# Patient Record
Sex: Female | Born: 1945 | Race: White | Hispanic: No | Marital: Married | Smoking: Never smoker
Health system: Southern US, Community
[De-identification: ages and names within clinical notes are randomized; demographics above are authoritative.]

## PROBLEM LIST (undated history)

## (undated) DIAGNOSIS — M7989 Other specified soft tissue disorders: Secondary | ICD-10-CM

## (undated) DIAGNOSIS — G4700 Insomnia, unspecified: Secondary | ICD-10-CM

## (undated) DIAGNOSIS — Z6841 Body Mass Index (BMI) 40.0 and over, adult: Secondary | ICD-10-CM

## (undated) DIAGNOSIS — Z8249 Family history of ischemic heart disease and other diseases of the circulatory system: Secondary | ICD-10-CM

## (undated) DIAGNOSIS — E785 Hyperlipidemia, unspecified: Secondary | ICD-10-CM

## (undated) DIAGNOSIS — R931 Abnormal findings on diagnostic imaging of heart and coronary circulation: Secondary | ICD-10-CM

## (undated) HISTORY — DX: Family history of ischemic heart disease and other diseases of the circulatory system: Z82.49

## (undated) HISTORY — DX: Insomnia, unspecified: G47.00

## (undated) HISTORY — DX: Abnormal findings on diagnostic imaging of heart and coronary circulation: R93.1

## (undated) HISTORY — DX: Hyperlipidemia, unspecified: E78.5

## (undated) HISTORY — PX: TONSILLECTOMY: SUR1361

## (undated) HISTORY — DX: Other specified soft tissue disorders: M79.89

## (undated) HISTORY — DX: Body Mass Index (BMI) 40.0 and over, adult: Z684

## (undated) HISTORY — PX: APPENDECTOMY: SHX54

## (undated) HISTORY — PX: OVARIAN CYST REMOVAL: SHX89

---

## 2014-02-05 ENCOUNTER — Encounter (HOSPITAL_COMMUNITY): Payer: Self-pay | Admitting: Emergency Medicine

## 2014-02-05 ENCOUNTER — Emergency Department (HOSPITAL_COMMUNITY): Payer: Medicare HMO

## 2014-02-05 ENCOUNTER — Emergency Department (HOSPITAL_COMMUNITY)
Admission: EM | Admit: 2014-02-05 | Discharge: 2014-02-05 | Disposition: A | Discharge status: Home / Self Care | Payer: Medicare HMO | Attending: Emergency Medicine | Admitting: Emergency Medicine

## 2014-02-05 DIAGNOSIS — R110 Nausea: Secondary | ICD-10-CM | POA: Insufficient documentation

## 2014-02-05 DIAGNOSIS — Y9389 Activity, other specified: Secondary | ICD-10-CM | POA: Diagnosis not present

## 2014-02-05 DIAGNOSIS — Y998 Other external cause status: Secondary | ICD-10-CM | POA: Insufficient documentation

## 2014-02-05 DIAGNOSIS — S99911A Unspecified injury of right ankle, initial encounter: Secondary | ICD-10-CM | POA: Insufficient documentation

## 2014-02-05 DIAGNOSIS — W01198A Fall on same level from slipping, tripping and stumbling with subsequent striking against other object, initial encounter: Secondary | ICD-10-CM | POA: Diagnosis not present

## 2014-02-05 DIAGNOSIS — Y92218 Other school as the place of occurrence of the external cause: Secondary | ICD-10-CM | POA: Diagnosis not present

## 2014-02-05 DIAGNOSIS — M79671 Pain in right foot: Secondary | ICD-10-CM

## 2014-02-05 DIAGNOSIS — Z79899 Other long term (current) drug therapy: Secondary | ICD-10-CM | POA: Insufficient documentation

## 2014-02-05 DIAGNOSIS — S0990XA Unspecified injury of head, initial encounter: Secondary | ICD-10-CM

## 2014-02-05 DIAGNOSIS — M25571 Pain in right ankle and joints of right foot: Secondary | ICD-10-CM

## 2014-02-05 DIAGNOSIS — W19XXXA Unspecified fall, initial encounter: Secondary | ICD-10-CM

## 2014-02-05 IMAGING — CR DG ANKLE COMPLETE 3+V*R*
3 series · 3 of 3 positions shown · non-contrast
Comparison: None.

CLINICAL DATA: Patient fell and twisted ankle earlier today. Pain
laterally

EXAM:
RIGHT ANKLE - COMPLETE 3+ VIEW

[x ankle ap right]
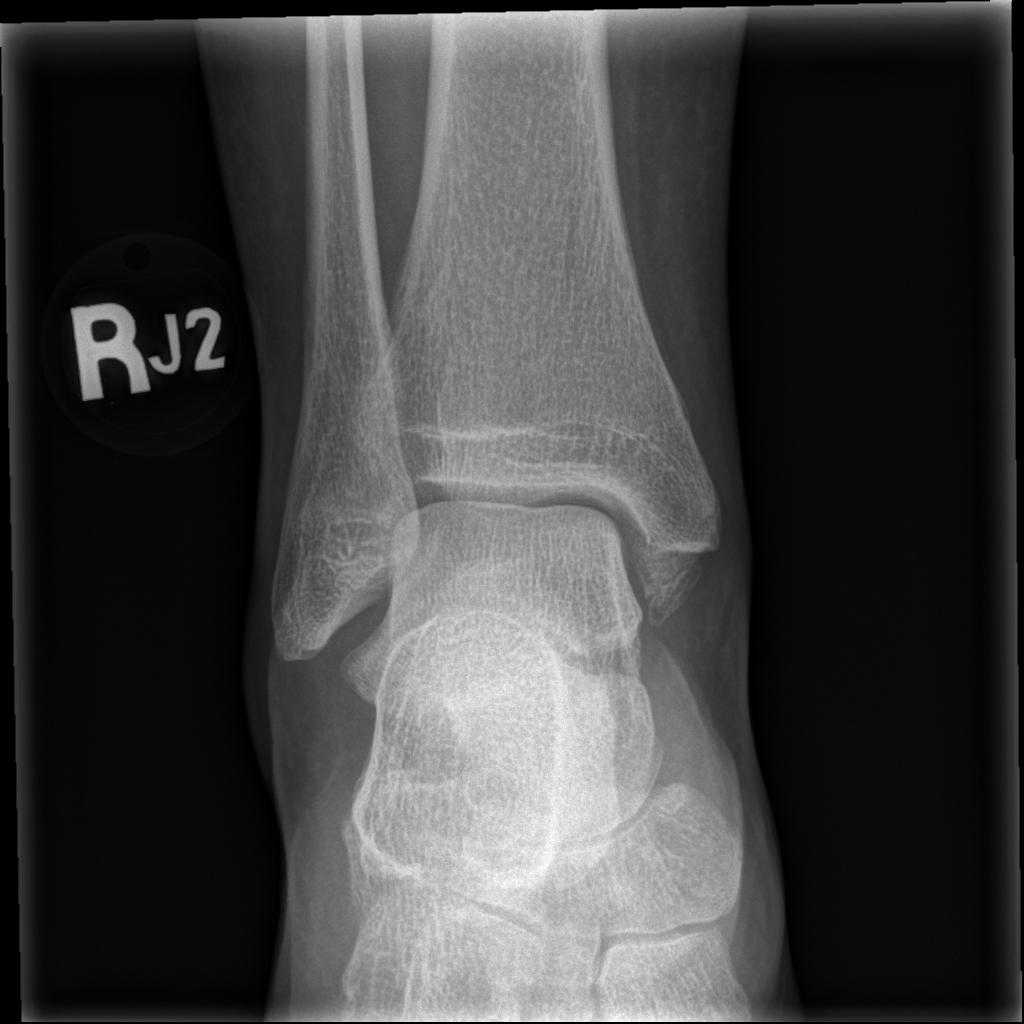

[x ankle obl right]
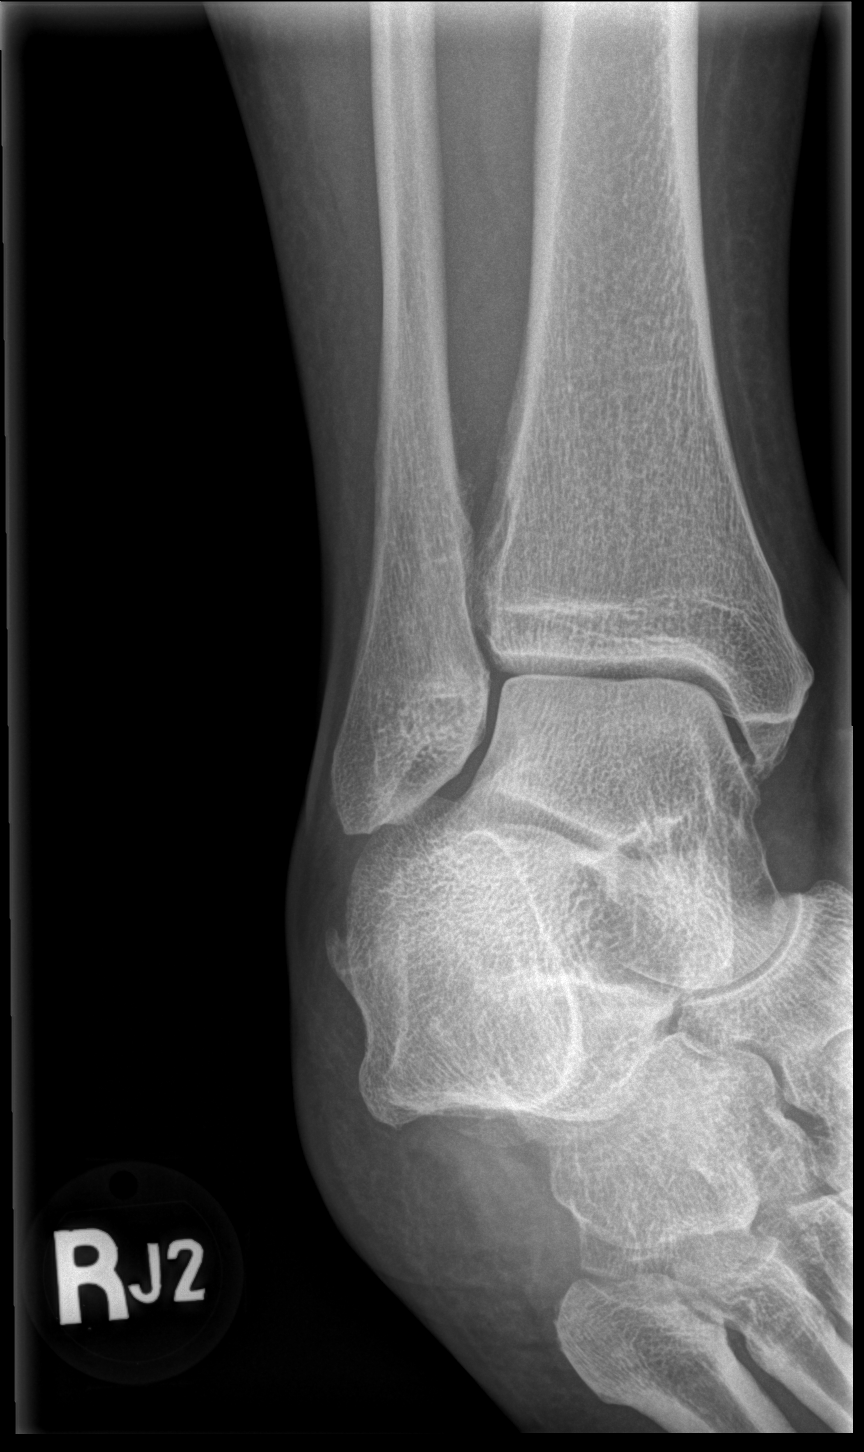

[x ankle lat right]
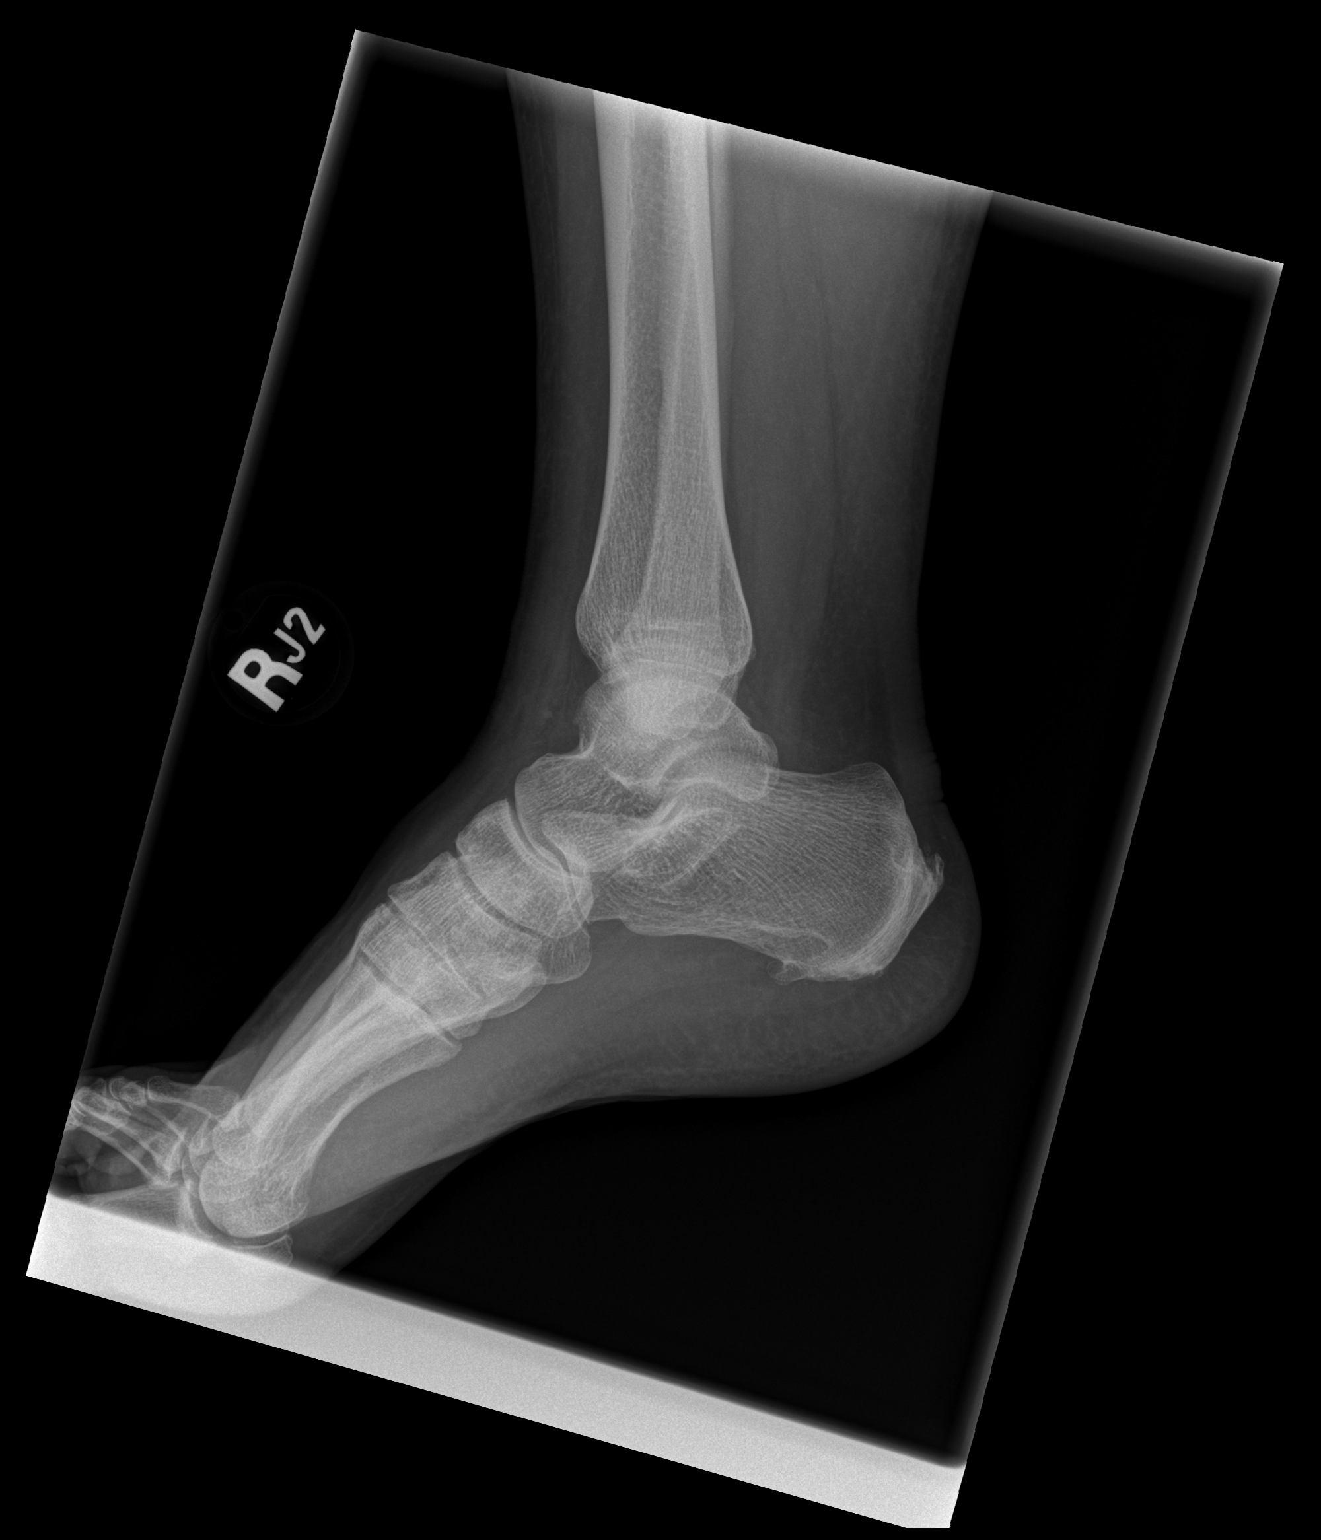

[3 of 3 positions shown; findings below may reference images not displayed]

FINDINGS: Frontal, oblique, and lateral views were obtained. There is no
fracture or joint effusion. Ankle mortise appears intact. There are
spurs arising from the posterior and inferior calcaneus.
IMPRESSION: Calcaneal spurs.  No fracture.  Mortise intact.

## 2014-02-05 IMAGING — CT CT HEAD W/O CM
2 series · 17 of 30 positions shown, 20 images · non-contrast
Comparison: None

CLINICAL DATA: Fall, syncope, states LEFT eye "went white", now
with headache

EXAM:
CT HEAD WITHOUT CONTRAST
TECHNIQUE: Contiguous axial images were obtained from the base of the skull
through the vertex without intravenous contrast.

[Series 2: head w/o · axial · non-contrast · 0.42mm/px · z∈[-223,-103]mm · 9 of 32 slices shown, 12 images]
[im 4/32  brain]
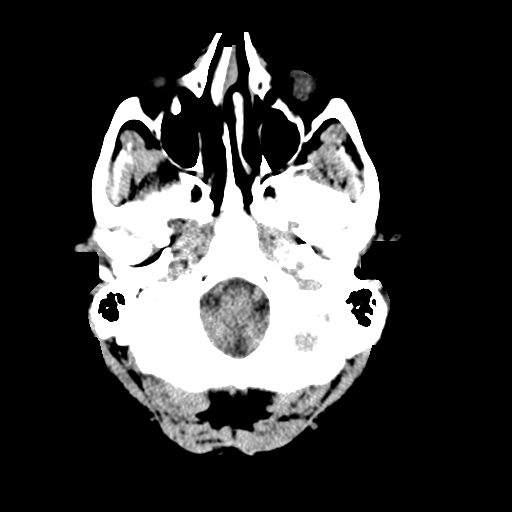
[im 4/32  bone]
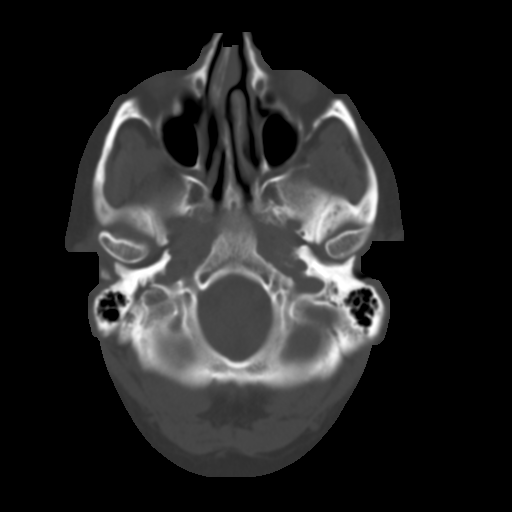
[im 7/32  brain]
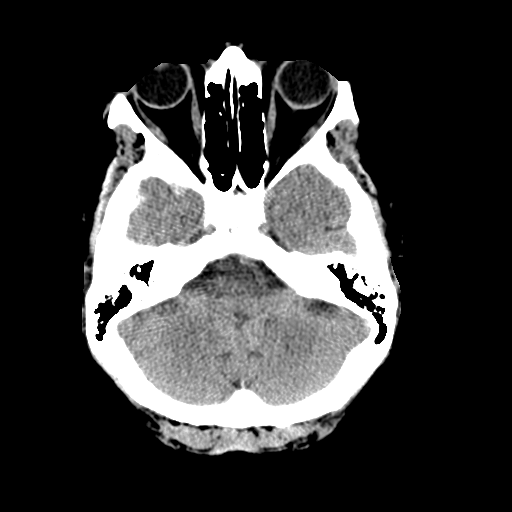
[im 10/32  brain]
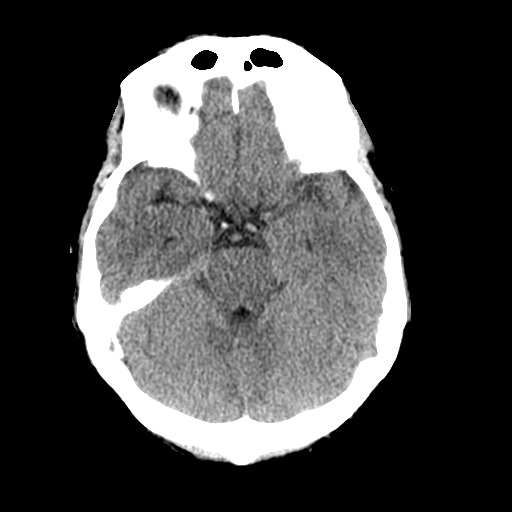
[im 13/32  brain]
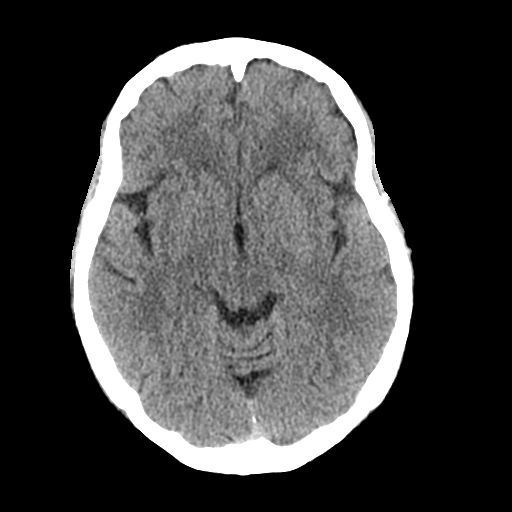
[im 16/32  brain]
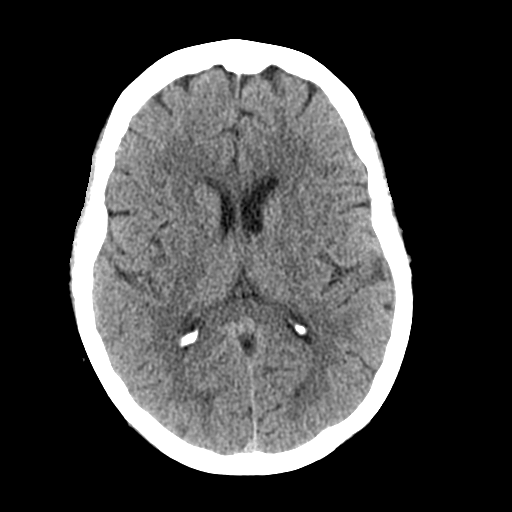
[im 16/32  bone]
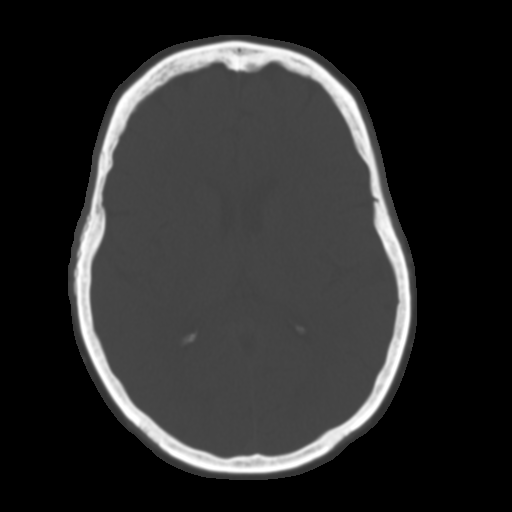
[im 19/32  brain]
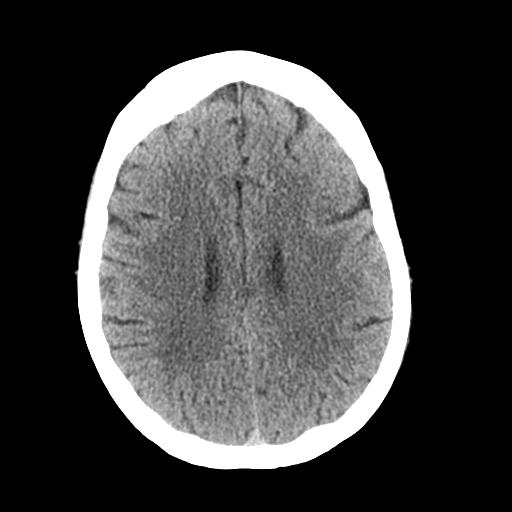
[im 22/32  brain]
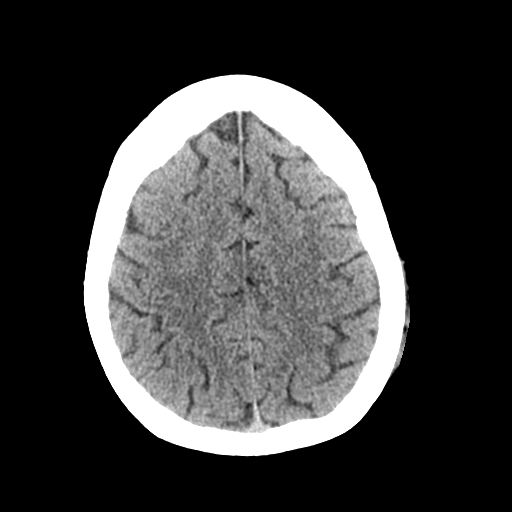
[im 25/32  brain]
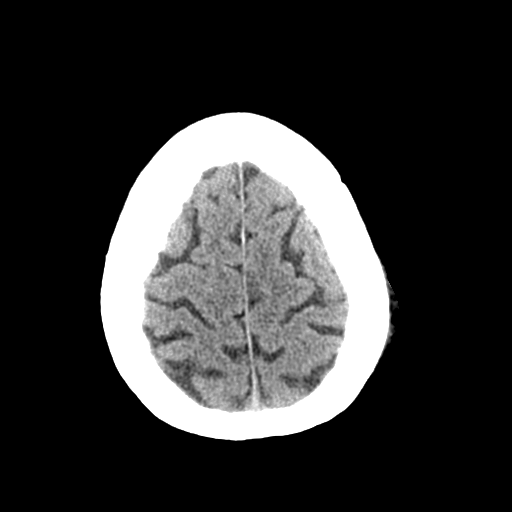
[im 28/32  brain]
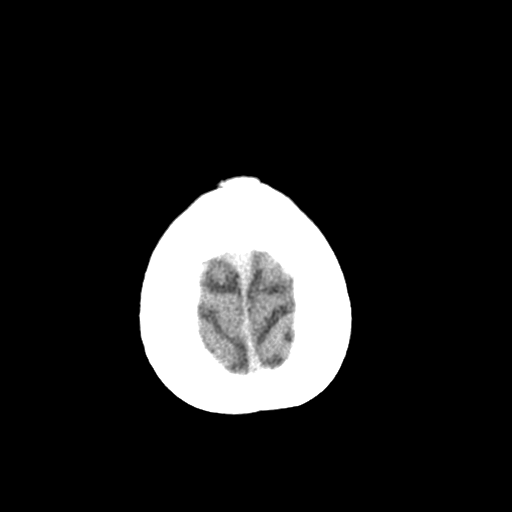
[im 28/32  bone]
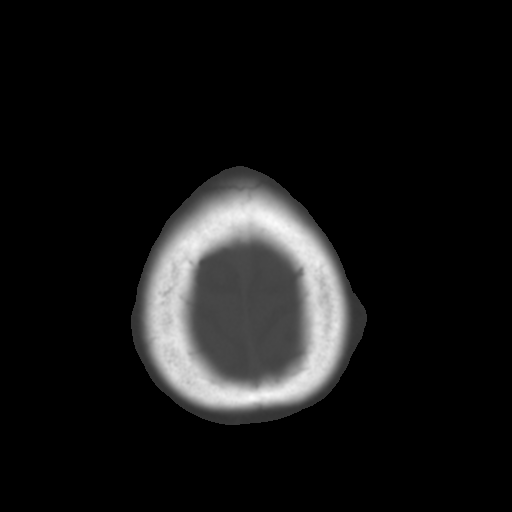

[Series 3: bone windows · axial · 0.42mm/px · z∈[-223,-100]mm · 8 of 53 slices shown]
[im 6/53  bone]
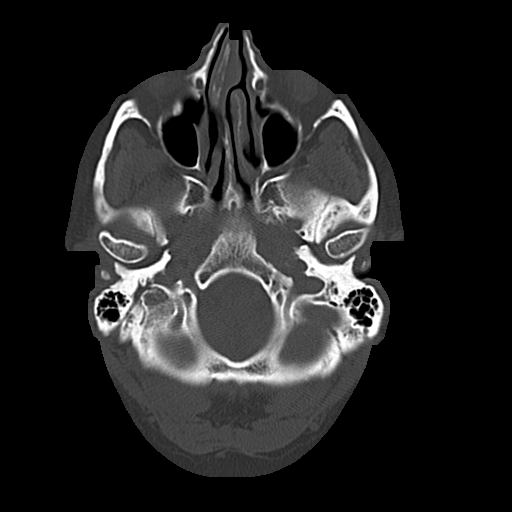
[im 12/53  bone]
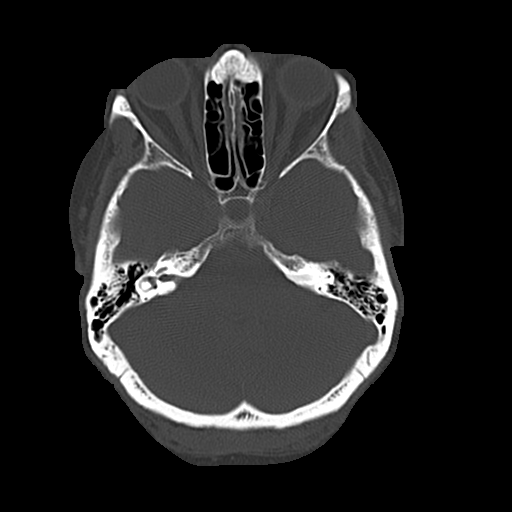
[im 18/53  bone]
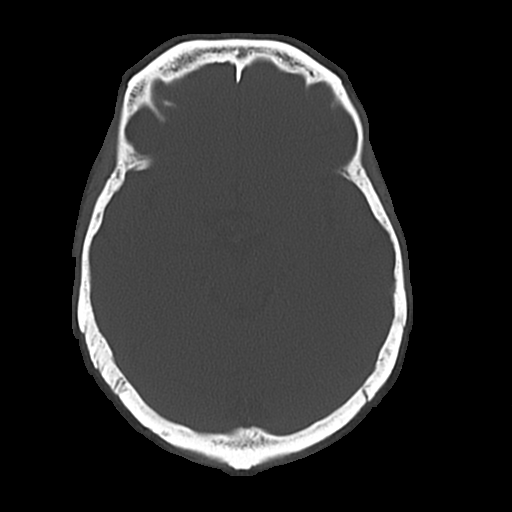
[im 24/53  bone]
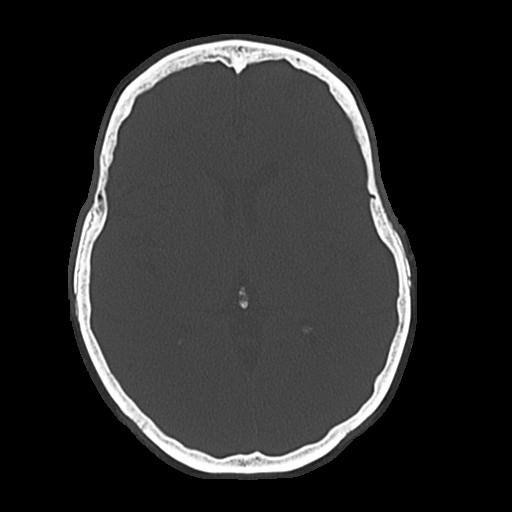
[im 29/53  bone]
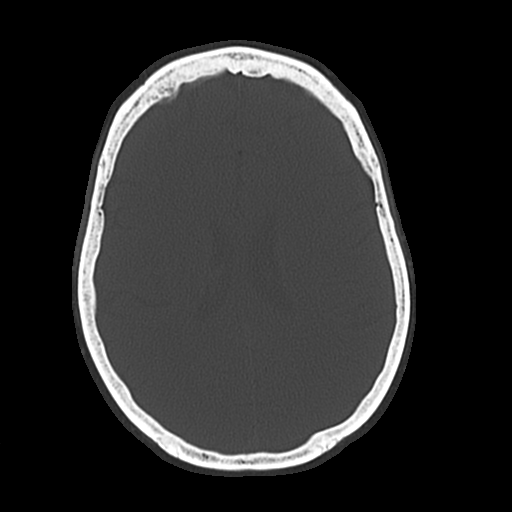
[im 35/53  bone]
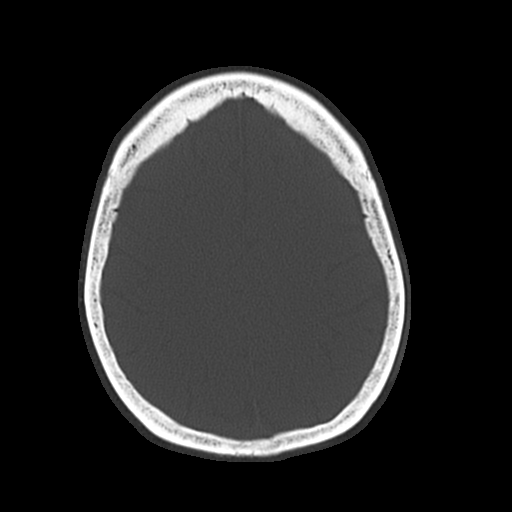
[im 41/53  bone]
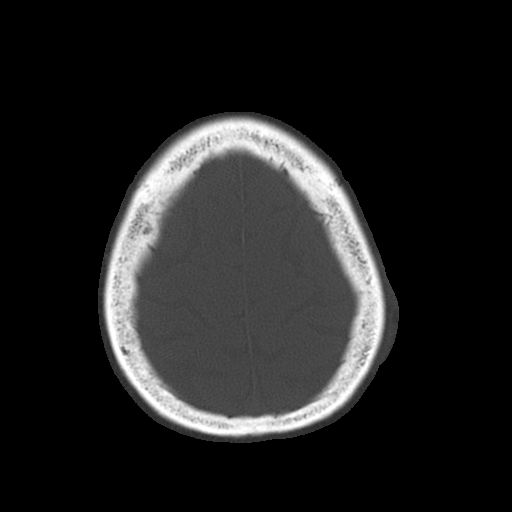
[im 47/53  bone]
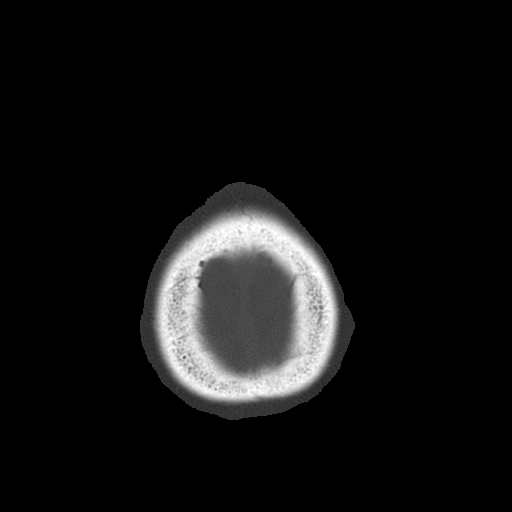

[17 of 30 positions shown; findings below may reference images not displayed]

FINDINGS: Normal ventricular morphology.

No midline shift or mass effect.

Normal appearance of brain parenchyma.

No intracranial hemorrhage, mass lesion, or acute infarction.

Visualized paranasal sinuses and mastoid air cells clear.

Bones unremarkable.
IMPRESSION: No acute intracranial abnormalities.

## 2014-02-05 MED ORDER — HYDROCODONE-ACETAMINOPHEN 5-325 MG PO TABS
1.0000 | ORAL_TABLET | Freq: Once | ORAL | Status: AC
  Administered 2014-02-05: 1 via ORAL
  Filled 2014-02-05: qty 1

## 2014-02-05 NOTE — ED Provider Notes (Signed)
CSN: [id number]     Arrival date & time 02/05/14  1434 History   First MD Initiated Contact with Patient 02/05/14 1509     Chief Complaint  Patient presents with  . Fall    +LOC  . Pain    headache and R foot pain   HPI  Patient is a 68 y.o. Female who presents to the ED after a fall.  Patient states that she was picking her grandaughter up from school and carrying her when she tripped and fell and landed on her left side and hit her head.  Patient lost consciousness and does not actually remember falling.  She states that she was able to get up but had severe dizziness and blurred vision out of her left eye for 10 minutes.  Patient feels as though her balance is slightly off.  Patient also complains of right ankle pain and thinks that she twisted it when she fell.  She is able to walk on it but it is painful.   History reviewed. No pertinent past medical history. Past Surgical History  Procedure Laterality Date  . Ovarian cyst removal      1972  . Appendectomy     No family history on file. History  Substance Use Topics  . Smoking status: Never Smoker   . Smokeless tobacco: Not on file  . Alcohol Use: Yes     Comment: one glass 5-6x/week   OB History    No data available     Review of Systems  Constitutional: Negative for fever, chills and fatigue.  Respiratory: Negative for chest tightness and shortness of breath.   Cardiovascular: Negative for chest pain and palpitations.  Gastrointestinal: Positive for nausea. Negative for vomiting, diarrhea, constipation, blood in stool and anal bleeding.  Genitourinary: Negative for urgency, hematuria, flank pain and dyspareunia.  Musculoskeletal: Positive for arthralgias and gait problem.  All other systems reviewed and are negative.     Allergies  Review of patient's allergies indicates no known allergies.  Home Medications   Prior to Admission medications   Medication Sig Start Date End Date Taking? Authorizing Provider   Multiple Vitamins-Minerals (MULTIVITAMIN WITH MINERALS) tablet Take 1 tablet by mouth every evening.   Yes Historical Provider, MD   BP 116/59 mmHg  Pulse 69  Temp(Src) 97.7 F (36.5 C) (Oral)  Resp 18  Ht 5\' 4"  (1.626 m)  Wt 200 lb (90.719 kg)  BMI 34.31 kg/m2  SpO2 95% Physical Exam  Constitutional: She is oriented to person, place, and time. She appears well-developed and well-nourished. No distress.  HENT:  Head: Normocephalic.  Mouth/Throat: Oropharynx is clear and moist. No oropharyngeal exudate.  Tenderness to the left temporal and left parietal skull.  There is a palpable small palpable hematoma.  There is no facial tenderness.    Eyes: Conjunctivae and EOM are normal. Pupils are equal, round, and reactive to light. No scleral icterus.  Neck: Normal range of motion. Neck supple.  Cardiovascular: Normal rate, regular rhythm, normal heart sounds and intact distal pulses.  Exam reveals no gallop and no friction rub.   No murmur heard. Pulmonary/Chest: Effort normal and breath sounds normal. No respiratory distress. She has no wheezes. She has no rales. She exhibits no tenderness.  Abdominal: Soft. Bowel sounds are normal. She exhibits no distension and no mass. There is no tenderness. There is no rebound and no guarding.  Musculoskeletal:       Right ankle: She exhibits decreased range of motion. She  exhibits no swelling, no ecchymosis, no deformity, no laceration and normal pulse. Tenderness. Lateral malleolus tenderness found. No medial malleolus, no AITFL, no CF ligament, no posterior TFL, no head of 5th metatarsal and no proximal fibula tenderness found.  Neurological: She is alert and oriented to person, place, and time. She has normal strength. No cranial nerve deficit or sensory deficit. She displays a negative Romberg sign. Coordination and gait normal.  Skin: Skin is warm and dry. She is not diaphoretic.  Psychiatric: She has a normal mood and affect. Her behavior is  normal. Judgment and thought content normal.  Nursing note and vitals reviewed.   ED Course  Procedures (including critical care time) Labs Review Labs Reviewed - No data to display  Imaging Review Dg Ankle Complete Right  02/05/2014   CLINICAL DATA:  Patient fell and twisted ankle earlier today. Pain laterally  EXAM: RIGHT ANKLE - COMPLETE 3+ VIEW  COMPARISON:  None.  FINDINGS: Frontal, oblique, and lateral views were obtained. There is no fracture or joint effusion. Ankle mortise appears intact. There are spurs arising from the posterior and inferior calcaneus.  IMPRESSION: Calcaneal spurs.  No fracture.  Mortise intact.   Electronically Signed   By: Lowella Grip M.D.   On: 02/05/2014 16:17   Ct Head Wo Contrast  02/05/2014   CLINICAL DATA:  Fall, syncope, states LEFT eye "went white", now with headache  EXAM: CT HEAD WITHOUT CONTRAST  TECHNIQUE: Contiguous axial images were obtained from the base of the skull through the vertex without intravenous contrast.  COMPARISON:  None  FINDINGS: Normal ventricular morphology.  No midline shift or mass effect.  Normal appearance of brain parenchyma.  No intracranial hemorrhage, mass lesion, or acute infarction.  Visualized paranasal sinuses and mastoid air cells clear.  Bones unremarkable.  IMPRESSION: No acute intracranial abnormalities.   Electronically Signed   By: Lavonia Dana M.D.   On: 02/05/2014 16:03     EKG Interpretation None      MDM   Final diagnoses:  Right ankle pain  Fall  Head injury, initial encounter  Right foot pain    Patient is a 68 y.o. Female who presents to the ED with right ankle pain and fall.  There is tenderness to the left skull.  There are no focal neurological deficits.  CT head is negative for fractures.  Right ankle xray is negative for fractures.  Will place the patient in an ASO for comfort.  Patient is to return for worsening somnolence, changes in behavior, or any other concerning symptoms.  Patient  states understanding and agreement.  Patient discussed with and seen by Dr. Eulis Foster who agrees with the above plan and workup.     Cherylann Parr, PA-C 02/05/14 [facility] [place], MD 02/05/14 2348

## 2014-02-05 NOTE — Discharge Instructions (Signed)
Use ice on the sore areas 3 times a day for 2 days, after that, use heat. Use ibuprofen 3 times a day if needed, for pain.   Head Injury You have received a head injury. It does not appear serious at this time. Headaches and vomiting are common following head injury. It should be easy to awaken from sleeping. Sometimes it is necessary for you to stay in the emergency department for a while for observation. Sometimes admission to the hospital may be needed. After injuries such as yours, most problems occur within the first 24 hours, but side effects may occur up to 7-10 days after the injury. It is important for you to carefully monitor your condition and contact your health care provider or seek immediate medical care if there is a change in your condition. WHAT ARE THE TYPES OF HEAD INJURIES? Head injuries can be as minor as a bump. Some head injuries can be more severe. More severe head injuries include:  A jarring injury to the brain (concussion).  A bruise of the brain (contusion). This mean there is bleeding in the brain that can cause swelling.  A cracked skull (skull fracture).  Bleeding in the brain that collects, clots, and forms a bump (hematoma). WHAT CAUSES A HEAD INJURY? A serious head injury is most likely to happen to someone who is in a car wreck and is not wearing a seat belt. Other causes of major head injuries include bicycle or motorcycle accidents, sports injuries, and falls. HOW ARE HEAD INJURIES DIAGNOSED? A complete history of the event leading to the injury and your current symptoms will be helpful in diagnosing head injuries. Many times, pictures of the brain, such as CT or MRI are needed to see the extent of the injury. Often, an overnight hospital stay is necessary for observation.  WHEN SHOULD I SEEK IMMEDIATE MEDICAL CARE?  You should get help right away if:  You have confusion or drowsiness.  You feel sick to your stomach (nauseous) or have continued, forceful  vomiting.  You have dizziness or unsteadiness that is getting worse.  You have severe, continued headaches not relieved by medicine. Only take over-the-counter or prescription medicines for pain, fever, or discomfort as directed by your health care provider.  You do not have normal function of the arms or legs or are unable to walk.  You notice changes in the black spots in the center of the colored part of your eye (pupil).  You have a clear or bloody fluid coming from your nose or ears.  You have a loss of vision. During the next 24 hours after the injury, you must stay with someone who can watch you for the warning signs. This person should contact local emergency services (911 in the [place] if you have seizures, you become unconscious, or you are unable to wake up. HOW CAN I PREVENT A HEAD INJURY IN THE FUTURE? The most important factor for preventing major head injuries is avoiding motor vehicle accidents. To minimize the potential for damage to your head, it is crucial to wear seat belts while riding in motor vehicles. Wearing helmets while bike riding and playing collision sports (like football) is also helpful. Also, avoiding dangerous activities around the house will further help reduce your risk of head injury.  WHEN CAN I RETURN TO NORMAL ACTIVITIES AND ATHLETICS? You should be reevaluated by your health care provider before returning to these activities. If you have any of the following symptoms, you should not  return to activities or contact sports until 1 week after the symptoms have stopped:  Persistent headache.  Dizziness or vertigo.  Poor attention and concentration.  Confusion.  Memory problems.  Nausea or vomiting.  Fatigue or tire easily.  Irritability.  Intolerant of bright lights or loud noises.  Anxiety or depression.  Disturbed sleep. MAKE SURE YOU:   Understand these instructions.  Will watch your condition.  Will get help right away if you are  not doing well or get worse. Document Released: 03/16/2005 Document Revised: 03/21/2013 Document Reviewed: 11/21/2012 [facility] Patient Information 2015 [place], [place]. This information is not intended to replace advice given to you by your health care provider. Make sure you discuss any questions you have with your health care provider.  Arthralgia Your caregiver has diagnosed you as suffering from an arthralgia. Arthralgia means there is pain in a joint. This can come from many reasons including:  Bruising the joint which causes soreness (inflammation) in the joint.  Wear and tear on the joints which occur as we grow older (osteoarthritis).  Overusing the joint.  Various forms of arthritis.  Infections of the joint. Regardless of the cause of pain in your joint, most of these different pains respond to anti-inflammatory drugs and rest. The exception to this is when a joint is infected, and these cases are treated with antibiotics, if it is a bacterial infection. HOME CARE INSTRUCTIONS   Rest the injured area for as long as directed by your caregiver. Then slowly start using the joint as directed by your caregiver and as the pain allows. Crutches as directed may be useful if the ankles, knees or hips are involved. If the knee was splinted or casted, continue use and care as directed. If an stretchy or elastic wrapping bandage has been applied today, it should be removed and re-applied every 3 to 4 hours. It should not be applied tightly, but firmly enough to keep swelling down. Watch toes and feet for swelling, bluish discoloration, coldness, numbness or excessive pain. If any of these problems (symptoms) occur, remove the ace bandage and re-apply more loosely. If these symptoms persist, contact your caregiver or return to this location.  For the first 24 hours, keep the injured extremity elevated on pillows while lying down.  Apply ice for 15-20 minutes to the sore joint every couple hours  while awake for the first half day. Then 03-04 times per day for the first 48 hours. Put the ice in a plastic bag and place a towel between the bag of ice and your skin.  Wear any splinting, casting, elastic bandage applications, or slings as instructed.  Only take over-the-counter or prescription medicines for pain, discomfort, or fever as directed by your caregiver. Do not use aspirin immediately after the injury unless instructed by your physician. Aspirin can cause increased bleeding and bruising of the tissues.  If you were given crutches, continue to use them as instructed and do not resume weight bearing on the sore joint until instructed. Persistent pain and inability to use the sore joint as directed for more than 2 to 3 days are warning signs indicating that you should see a caregiver for a follow-up visit as soon as possible. Initially, a hairline fracture (break in bone) may not be evident on X-rays. Persistent pain and swelling indicate that further evaluation, non-weight bearing or use of the joint (use of crutches or slings as instructed), or further X-rays are indicated. X-rays may sometimes not show a small fracture  until a week or 10 days later. Make a follow-up appointment with your own caregiver or one to whom we have referred you. A radiologist (specialist in reading X-rays) may read your X-rays. Make sure you know how you are to obtain your X-ray results. Do not assume everything is normal if you do not hear from [place]. SEEK MEDICAL CARE IF: Bruising, swelling, or pain increases. SEEK IMMEDIATE MEDICAL CARE IF:   Your fingers or toes are numb or blue.  The pain is not responding to medications and continues to stay the same or get worse.  The pain in your joint becomes severe.  You develop a fever over 102 F (38.9 C).  It becomes impossible to move or use the joint. MAKE SURE YOU:   Understand these instructions.  Will watch your condition.  Will get help right away if  you are not doing well or get worse. Document Released: 03/16/2005 Document Revised: 06/08/2011 Document Reviewed: 11/02/2007 [facility] Patient Information 2015 [place], [place]. This information is not intended to replace advice given to you by your health care provider. Make sure you discuss any questions you have with your health care provider.

## 2014-02-05 NOTE — ED Notes (Signed)
Pt A+Ox4, reports was carrying her grandchild and numerous bags and tripped over a curb and fell.  Pt denies remembering event, reports "i just remember waking up on the ground and feeling confused".  Pt c/o L side of head pain, R ankle pain and dizziness.  Reports "couldn't see out of my L eye for a few minutes", reports back to normal vision at this time.  Pt denies dizziness, weakness, CP/palpitations or any other complaints prior to fall.  Pt denies blood thinners.  MAEI, ambulatory with slow steady gait.  Speaking full/clear sentences, rr even/un-lab.  Neuros grossly intact.  NAD.

## 2014-02-05 NOTE — ED Provider Notes (Signed)
  Face-to-face evaluation   History: patient walking, carrying her granddaughter, when she tripped and fell.  She believes that she lost consciousness briefly.  She had transient decreased vision in the left eye, but no prolonged neurologic symptoms.  She is able to walk and has mild pain in left hip, and right foot.  Physical exam:alert, calm, cooperative.  Right foot tender to palpation without deformity.  She is alert and oriented 3.  No dysarthria or aphasia.  She was offered additional pain medication, and declined.  Medical screening examination/treatment/procedure(s) were conducted as a shared visit with non-physician practitioner(s) and myself.  I personally evaluated the patient during the encounter   Richarda Blade, MD 02/05/14 2348

## 2014-08-29 DIAGNOSIS — R0683 Snoring: Secondary | ICD-10-CM | POA: Diagnosis not present

## 2014-08-29 DIAGNOSIS — Z0000 Encounter for general adult medical examination without abnormal findings: Secondary | ICD-10-CM | POA: Diagnosis not present

## 2014-08-29 DIAGNOSIS — M722 Plantar fascial fibromatosis: Secondary | ICD-10-CM | POA: Diagnosis not present

## 2014-08-29 DIAGNOSIS — Z1389 Encounter for screening for other disorder: Secondary | ICD-10-CM | POA: Diagnosis not present

## 2014-08-29 DIAGNOSIS — F418 Other specified anxiety disorders: Secondary | ICD-10-CM | POA: Diagnosis not present

## 2014-08-29 DIAGNOSIS — R32 Unspecified urinary incontinence: Secondary | ICD-10-CM | POA: Diagnosis not present

## 2014-08-29 DIAGNOSIS — Z23 Encounter for immunization: Secondary | ICD-10-CM | POA: Diagnosis not present

## 2014-09-14 DIAGNOSIS — Z0000 Encounter for general adult medical examination without abnormal findings: Secondary | ICD-10-CM | POA: Diagnosis not present

## 2014-09-14 DIAGNOSIS — Z136 Encounter for screening for cardiovascular disorders: Secondary | ICD-10-CM | POA: Diagnosis not present

## 2014-09-14 DIAGNOSIS — Z23 Encounter for immunization: Secondary | ICD-10-CM | POA: Diagnosis not present

## 2014-09-14 DIAGNOSIS — Z1389 Encounter for screening for other disorder: Secondary | ICD-10-CM | POA: Diagnosis not present

## 2014-10-12 DIAGNOSIS — N3944 Nocturnal enuresis: Secondary | ICD-10-CM | POA: Diagnosis not present

## 2014-10-12 DIAGNOSIS — N3946 Mixed incontinence: Secondary | ICD-10-CM | POA: Diagnosis not present

## 2014-10-12 DIAGNOSIS — R351 Nocturia: Secondary | ICD-10-CM | POA: Diagnosis not present

## 2014-11-09 DIAGNOSIS — N3946 Mixed incontinence: Secondary | ICD-10-CM | POA: Diagnosis not present

## 2014-11-09 DIAGNOSIS — N3944 Nocturnal enuresis: Secondary | ICD-10-CM | POA: Diagnosis not present

## 2014-11-09 DIAGNOSIS — R351 Nocturia: Secondary | ICD-10-CM | POA: Diagnosis not present

## 2014-11-23 DIAGNOSIS — N3946 Mixed incontinence: Secondary | ICD-10-CM | POA: Diagnosis not present

## 2014-11-23 DIAGNOSIS — N3944 Nocturnal enuresis: Secondary | ICD-10-CM | POA: Diagnosis not present

## 2014-12-24 DIAGNOSIS — G4730 Sleep apnea, unspecified: Secondary | ICD-10-CM | POA: Diagnosis not present

## 2015-01-04 DIAGNOSIS — N3946 Mixed incontinence: Secondary | ICD-10-CM | POA: Diagnosis not present

## 2015-01-04 DIAGNOSIS — N3944 Nocturnal enuresis: Secondary | ICD-10-CM | POA: Diagnosis not present

## 2015-01-21 DIAGNOSIS — G4733 Obstructive sleep apnea (adult) (pediatric): Secondary | ICD-10-CM | POA: Diagnosis not present

## 2015-02-04 DIAGNOSIS — F418 Other specified anxiety disorders: Secondary | ICD-10-CM | POA: Diagnosis not present

## 2015-02-04 DIAGNOSIS — G4733 Obstructive sleep apnea (adult) (pediatric): Secondary | ICD-10-CM | POA: Diagnosis not present

## 2015-02-04 DIAGNOSIS — R32 Unspecified urinary incontinence: Secondary | ICD-10-CM | POA: Diagnosis not present

## 2015-02-28 DIAGNOSIS — Z6834 Body mass index (BMI) 34.0-34.9, adult: Secondary | ICD-10-CM | POA: Diagnosis not present

## 2015-02-28 DIAGNOSIS — R030 Elevated blood-pressure reading, without diagnosis of hypertension: Secondary | ICD-10-CM | POA: Diagnosis not present

## 2015-02-28 DIAGNOSIS — E782 Mixed hyperlipidemia: Secondary | ICD-10-CM | POA: Diagnosis not present

## 2015-02-28 DIAGNOSIS — Z0000 Encounter for general adult medical examination without abnormal findings: Secondary | ICD-10-CM | POA: Diagnosis not present

## 2015-03-06 DIAGNOSIS — G4733 Obstructive sleep apnea (adult) (pediatric): Secondary | ICD-10-CM | POA: Diagnosis not present

## 2015-03-06 DIAGNOSIS — F418 Other specified anxiety disorders: Secondary | ICD-10-CM | POA: Diagnosis not present

## 2015-03-06 DIAGNOSIS — R32 Unspecified urinary incontinence: Secondary | ICD-10-CM | POA: Diagnosis not present

## 2015-04-06 DIAGNOSIS — F418 Other specified anxiety disorders: Secondary | ICD-10-CM | POA: Diagnosis not present

## 2015-04-06 DIAGNOSIS — G4733 Obstructive sleep apnea (adult) (pediatric): Secondary | ICD-10-CM | POA: Diagnosis not present

## 2015-04-06 DIAGNOSIS — R32 Unspecified urinary incontinence: Secondary | ICD-10-CM | POA: Diagnosis not present

## 2015-04-22 DIAGNOSIS — G4733 Obstructive sleep apnea (adult) (pediatric): Secondary | ICD-10-CM | POA: Diagnosis not present

## 2015-05-07 DIAGNOSIS — G4733 Obstructive sleep apnea (adult) (pediatric): Secondary | ICD-10-CM | POA: Diagnosis not present

## 2015-05-07 DIAGNOSIS — R32 Unspecified urinary incontinence: Secondary | ICD-10-CM | POA: Diagnosis not present

## 2015-05-07 DIAGNOSIS — F418 Other specified anxiety disorders: Secondary | ICD-10-CM | POA: Diagnosis not present

## 2015-05-16 DIAGNOSIS — G4733 Obstructive sleep apnea (adult) (pediatric): Secondary | ICD-10-CM | POA: Diagnosis not present

## 2015-06-04 DIAGNOSIS — G4733 Obstructive sleep apnea (adult) (pediatric): Secondary | ICD-10-CM | POA: Diagnosis not present

## 2015-06-04 DIAGNOSIS — F418 Other specified anxiety disorders: Secondary | ICD-10-CM | POA: Diagnosis not present

## 2015-06-04 DIAGNOSIS — R32 Unspecified urinary incontinence: Secondary | ICD-10-CM | POA: Diagnosis not present

## 2015-07-05 DIAGNOSIS — F418 Other specified anxiety disorders: Secondary | ICD-10-CM | POA: Diagnosis not present

## 2015-07-05 DIAGNOSIS — R32 Unspecified urinary incontinence: Secondary | ICD-10-CM | POA: Diagnosis not present

## 2015-07-05 DIAGNOSIS — G4733 Obstructive sleep apnea (adult) (pediatric): Secondary | ICD-10-CM | POA: Diagnosis not present

## 2015-08-04 DIAGNOSIS — R32 Unspecified urinary incontinence: Secondary | ICD-10-CM | POA: Diagnosis not present

## 2015-08-04 DIAGNOSIS — F418 Other specified anxiety disorders: Secondary | ICD-10-CM | POA: Diagnosis not present

## 2015-08-04 DIAGNOSIS — G4733 Obstructive sleep apnea (adult) (pediatric): Secondary | ICD-10-CM | POA: Diagnosis not present

## 2015-08-09 DIAGNOSIS — G4733 Obstructive sleep apnea (adult) (pediatric): Secondary | ICD-10-CM | POA: Diagnosis not present

## 2015-08-13 DIAGNOSIS — G4733 Obstructive sleep apnea (adult) (pediatric): Secondary | ICD-10-CM | POA: Diagnosis not present

## 2015-08-19 DIAGNOSIS — H5210 Myopia, unspecified eye: Secondary | ICD-10-CM | POA: Diagnosis not present

## 2015-08-19 DIAGNOSIS — H2510 Age-related nuclear cataract, unspecified eye: Secondary | ICD-10-CM | POA: Diagnosis not present

## 2015-08-19 DIAGNOSIS — H524 Presbyopia: Secondary | ICD-10-CM | POA: Diagnosis not present

## 2015-09-04 DIAGNOSIS — R32 Unspecified urinary incontinence: Secondary | ICD-10-CM | POA: Diagnosis not present

## 2015-09-04 DIAGNOSIS — F418 Other specified anxiety disorders: Secondary | ICD-10-CM | POA: Diagnosis not present

## 2015-09-04 DIAGNOSIS — G4733 Obstructive sleep apnea (adult) (pediatric): Secondary | ICD-10-CM | POA: Diagnosis not present

## 2015-10-04 DIAGNOSIS — G4733 Obstructive sleep apnea (adult) (pediatric): Secondary | ICD-10-CM | POA: Diagnosis not present

## 2015-10-04 DIAGNOSIS — F418 Other specified anxiety disorders: Secondary | ICD-10-CM | POA: Diagnosis not present

## 2015-10-04 DIAGNOSIS — R32 Unspecified urinary incontinence: Secondary | ICD-10-CM | POA: Diagnosis not present

## 2015-11-04 DIAGNOSIS — R32 Unspecified urinary incontinence: Secondary | ICD-10-CM | POA: Diagnosis not present

## 2015-11-04 DIAGNOSIS — F418 Other specified anxiety disorders: Secondary | ICD-10-CM | POA: Diagnosis not present

## 2015-11-04 DIAGNOSIS — G4733 Obstructive sleep apnea (adult) (pediatric): Secondary | ICD-10-CM | POA: Diagnosis not present

## 2015-11-14 DIAGNOSIS — G4733 Obstructive sleep apnea (adult) (pediatric): Secondary | ICD-10-CM | POA: Diagnosis not present

## 2015-12-05 DIAGNOSIS — F418 Other specified anxiety disorders: Secondary | ICD-10-CM | POA: Diagnosis not present

## 2015-12-05 DIAGNOSIS — G4733 Obstructive sleep apnea (adult) (pediatric): Secondary | ICD-10-CM | POA: Diagnosis not present

## 2015-12-05 DIAGNOSIS — R32 Unspecified urinary incontinence: Secondary | ICD-10-CM | POA: Diagnosis not present

## 2016-01-04 DIAGNOSIS — G4733 Obstructive sleep apnea (adult) (pediatric): Secondary | ICD-10-CM | POA: Diagnosis not present

## 2016-01-04 DIAGNOSIS — R32 Unspecified urinary incontinence: Secondary | ICD-10-CM | POA: Diagnosis not present

## 2016-01-04 DIAGNOSIS — F418 Other specified anxiety disorders: Secondary | ICD-10-CM | POA: Diagnosis not present

## 2016-02-04 DIAGNOSIS — R32 Unspecified urinary incontinence: Secondary | ICD-10-CM | POA: Diagnosis not present

## 2016-02-04 DIAGNOSIS — F418 Other specified anxiety disorders: Secondary | ICD-10-CM | POA: Diagnosis not present

## 2016-02-04 DIAGNOSIS — G4733 Obstructive sleep apnea (adult) (pediatric): Secondary | ICD-10-CM | POA: Diagnosis not present

## 2016-02-14 DIAGNOSIS — G4733 Obstructive sleep apnea (adult) (pediatric): Secondary | ICD-10-CM | POA: Diagnosis not present

## 2016-05-06 DIAGNOSIS — G4733 Obstructive sleep apnea (adult) (pediatric): Secondary | ICD-10-CM | POA: Diagnosis not present

## 2016-05-18 DIAGNOSIS — G4733 Obstructive sleep apnea (adult) (pediatric): Secondary | ICD-10-CM | POA: Diagnosis not present

## 2016-07-08 DIAGNOSIS — M545 Low back pain: Secondary | ICD-10-CM | POA: Diagnosis not present

## 2016-07-08 DIAGNOSIS — M542 Cervicalgia: Secondary | ICD-10-CM | POA: Diagnosis not present

## 2016-07-08 DIAGNOSIS — M9903 Segmental and somatic dysfunction of lumbar region: Secondary | ICD-10-CM | POA: Diagnosis not present

## 2016-07-08 DIAGNOSIS — M9901 Segmental and somatic dysfunction of cervical region: Secondary | ICD-10-CM | POA: Diagnosis not present

## 2016-07-09 DIAGNOSIS — M545 Low back pain: Secondary | ICD-10-CM | POA: Diagnosis not present

## 2016-07-09 DIAGNOSIS — M9903 Segmental and somatic dysfunction of lumbar region: Secondary | ICD-10-CM | POA: Diagnosis not present

## 2016-07-09 DIAGNOSIS — M9901 Segmental and somatic dysfunction of cervical region: Secondary | ICD-10-CM | POA: Diagnosis not present

## 2016-07-09 DIAGNOSIS — M542 Cervicalgia: Secondary | ICD-10-CM | POA: Diagnosis not present

## 2016-07-13 DIAGNOSIS — M542 Cervicalgia: Secondary | ICD-10-CM | POA: Diagnosis not present

## 2016-07-13 DIAGNOSIS — M9903 Segmental and somatic dysfunction of lumbar region: Secondary | ICD-10-CM | POA: Diagnosis not present

## 2016-07-13 DIAGNOSIS — M9901 Segmental and somatic dysfunction of cervical region: Secondary | ICD-10-CM | POA: Diagnosis not present

## 2016-07-13 DIAGNOSIS — M545 Low back pain: Secondary | ICD-10-CM | POA: Diagnosis not present

## 2016-07-14 DIAGNOSIS — M545 Low back pain: Secondary | ICD-10-CM | POA: Diagnosis not present

## 2016-07-14 DIAGNOSIS — M9903 Segmental and somatic dysfunction of lumbar region: Secondary | ICD-10-CM | POA: Diagnosis not present

## 2016-07-14 DIAGNOSIS — M9901 Segmental and somatic dysfunction of cervical region: Secondary | ICD-10-CM | POA: Diagnosis not present

## 2016-07-14 DIAGNOSIS — M542 Cervicalgia: Secondary | ICD-10-CM | POA: Diagnosis not present

## 2016-07-16 DIAGNOSIS — M9903 Segmental and somatic dysfunction of lumbar region: Secondary | ICD-10-CM | POA: Diagnosis not present

## 2016-07-16 DIAGNOSIS — M9901 Segmental and somatic dysfunction of cervical region: Secondary | ICD-10-CM | POA: Diagnosis not present

## 2016-07-16 DIAGNOSIS — M545 Low back pain: Secondary | ICD-10-CM | POA: Diagnosis not present

## 2016-07-16 DIAGNOSIS — M542 Cervicalgia: Secondary | ICD-10-CM | POA: Diagnosis not present

## 2016-07-21 DIAGNOSIS — M542 Cervicalgia: Secondary | ICD-10-CM | POA: Diagnosis not present

## 2016-07-21 DIAGNOSIS — M9903 Segmental and somatic dysfunction of lumbar region: Secondary | ICD-10-CM | POA: Diagnosis not present

## 2016-07-21 DIAGNOSIS — M545 Low back pain: Secondary | ICD-10-CM | POA: Diagnosis not present

## 2016-07-21 DIAGNOSIS — M9901 Segmental and somatic dysfunction of cervical region: Secondary | ICD-10-CM | POA: Diagnosis not present

## 2016-07-22 DIAGNOSIS — M545 Low back pain: Secondary | ICD-10-CM | POA: Diagnosis not present

## 2016-07-22 DIAGNOSIS — M542 Cervicalgia: Secondary | ICD-10-CM | POA: Diagnosis not present

## 2016-07-22 DIAGNOSIS — M9903 Segmental and somatic dysfunction of lumbar region: Secondary | ICD-10-CM | POA: Diagnosis not present

## 2016-07-22 DIAGNOSIS — M9901 Segmental and somatic dysfunction of cervical region: Secondary | ICD-10-CM | POA: Diagnosis not present

## 2016-07-23 DIAGNOSIS — M542 Cervicalgia: Secondary | ICD-10-CM | POA: Diagnosis not present

## 2016-07-23 DIAGNOSIS — M9901 Segmental and somatic dysfunction of cervical region: Secondary | ICD-10-CM | POA: Diagnosis not present

## 2016-07-23 DIAGNOSIS — M9903 Segmental and somatic dysfunction of lumbar region: Secondary | ICD-10-CM | POA: Diagnosis not present

## 2016-07-23 DIAGNOSIS — M545 Low back pain: Secondary | ICD-10-CM | POA: Diagnosis not present

## 2016-07-28 DIAGNOSIS — M9901 Segmental and somatic dysfunction of cervical region: Secondary | ICD-10-CM | POA: Diagnosis not present

## 2016-07-28 DIAGNOSIS — M9903 Segmental and somatic dysfunction of lumbar region: Secondary | ICD-10-CM | POA: Diagnosis not present

## 2016-07-28 DIAGNOSIS — M542 Cervicalgia: Secondary | ICD-10-CM | POA: Diagnosis not present

## 2016-07-28 DIAGNOSIS — M545 Low back pain: Secondary | ICD-10-CM | POA: Diagnosis not present

## 2016-07-30 DIAGNOSIS — M542 Cervicalgia: Secondary | ICD-10-CM | POA: Diagnosis not present

## 2016-07-30 DIAGNOSIS — M545 Low back pain: Secondary | ICD-10-CM | POA: Diagnosis not present

## 2016-07-30 DIAGNOSIS — M9903 Segmental and somatic dysfunction of lumbar region: Secondary | ICD-10-CM | POA: Diagnosis not present

## 2016-07-30 DIAGNOSIS — M9901 Segmental and somatic dysfunction of cervical region: Secondary | ICD-10-CM | POA: Diagnosis not present

## 2016-08-03 DIAGNOSIS — M542 Cervicalgia: Secondary | ICD-10-CM | POA: Diagnosis not present

## 2016-08-03 DIAGNOSIS — M9903 Segmental and somatic dysfunction of lumbar region: Secondary | ICD-10-CM | POA: Diagnosis not present

## 2016-08-03 DIAGNOSIS — M545 Low back pain: Secondary | ICD-10-CM | POA: Diagnosis not present

## 2016-08-03 DIAGNOSIS — M9901 Segmental and somatic dysfunction of cervical region: Secondary | ICD-10-CM | POA: Diagnosis not present

## 2016-08-04 DIAGNOSIS — M9901 Segmental and somatic dysfunction of cervical region: Secondary | ICD-10-CM | POA: Diagnosis not present

## 2016-08-04 DIAGNOSIS — M9903 Segmental and somatic dysfunction of lumbar region: Secondary | ICD-10-CM | POA: Diagnosis not present

## 2016-08-04 DIAGNOSIS — M545 Low back pain: Secondary | ICD-10-CM | POA: Diagnosis not present

## 2016-08-04 DIAGNOSIS — M542 Cervicalgia: Secondary | ICD-10-CM | POA: Diagnosis not present

## 2016-08-06 DIAGNOSIS — M542 Cervicalgia: Secondary | ICD-10-CM | POA: Diagnosis not present

## 2016-08-06 DIAGNOSIS — M9903 Segmental and somatic dysfunction of lumbar region: Secondary | ICD-10-CM | POA: Diagnosis not present

## 2016-08-06 DIAGNOSIS — M9901 Segmental and somatic dysfunction of cervical region: Secondary | ICD-10-CM | POA: Diagnosis not present

## 2016-08-06 DIAGNOSIS — M545 Low back pain: Secondary | ICD-10-CM | POA: Diagnosis not present

## 2016-08-11 DIAGNOSIS — M9901 Segmental and somatic dysfunction of cervical region: Secondary | ICD-10-CM | POA: Diagnosis not present

## 2016-08-11 DIAGNOSIS — M9903 Segmental and somatic dysfunction of lumbar region: Secondary | ICD-10-CM | POA: Diagnosis not present

## 2016-08-11 DIAGNOSIS — M542 Cervicalgia: Secondary | ICD-10-CM | POA: Diagnosis not present

## 2016-08-11 DIAGNOSIS — M545 Low back pain: Secondary | ICD-10-CM | POA: Diagnosis not present

## 2016-08-13 DIAGNOSIS — M542 Cervicalgia: Secondary | ICD-10-CM | POA: Diagnosis not present

## 2016-08-13 DIAGNOSIS — M545 Low back pain: Secondary | ICD-10-CM | POA: Diagnosis not present

## 2016-08-13 DIAGNOSIS — M9903 Segmental and somatic dysfunction of lumbar region: Secondary | ICD-10-CM | POA: Diagnosis not present

## 2016-08-13 DIAGNOSIS — M9901 Segmental and somatic dysfunction of cervical region: Secondary | ICD-10-CM | POA: Diagnosis not present

## 2016-08-18 DIAGNOSIS — G4733 Obstructive sleep apnea (adult) (pediatric): Secondary | ICD-10-CM | POA: Diagnosis not present

## 2016-08-19 DIAGNOSIS — M545 Low back pain: Secondary | ICD-10-CM | POA: Diagnosis not present

## 2016-08-19 DIAGNOSIS — M9901 Segmental and somatic dysfunction of cervical region: Secondary | ICD-10-CM | POA: Diagnosis not present

## 2016-08-19 DIAGNOSIS — M9903 Segmental and somatic dysfunction of lumbar region: Secondary | ICD-10-CM | POA: Diagnosis not present

## 2016-08-19 DIAGNOSIS — M542 Cervicalgia: Secondary | ICD-10-CM | POA: Diagnosis not present

## 2016-08-20 DIAGNOSIS — M9903 Segmental and somatic dysfunction of lumbar region: Secondary | ICD-10-CM | POA: Diagnosis not present

## 2016-08-20 DIAGNOSIS — M542 Cervicalgia: Secondary | ICD-10-CM | POA: Diagnosis not present

## 2016-08-20 DIAGNOSIS — M9901 Segmental and somatic dysfunction of cervical region: Secondary | ICD-10-CM | POA: Diagnosis not present

## 2016-08-20 DIAGNOSIS — M545 Low back pain: Secondary | ICD-10-CM | POA: Diagnosis not present

## 2016-08-25 DIAGNOSIS — M542 Cervicalgia: Secondary | ICD-10-CM | POA: Diagnosis not present

## 2016-08-25 DIAGNOSIS — M9901 Segmental and somatic dysfunction of cervical region: Secondary | ICD-10-CM | POA: Diagnosis not present

## 2016-08-25 DIAGNOSIS — M545 Low back pain: Secondary | ICD-10-CM | POA: Diagnosis not present

## 2016-08-25 DIAGNOSIS — M9903 Segmental and somatic dysfunction of lumbar region: Secondary | ICD-10-CM | POA: Diagnosis not present

## 2016-08-27 DIAGNOSIS — M545 Low back pain: Secondary | ICD-10-CM | POA: Diagnosis not present

## 2016-08-27 DIAGNOSIS — M9903 Segmental and somatic dysfunction of lumbar region: Secondary | ICD-10-CM | POA: Diagnosis not present

## 2016-08-27 DIAGNOSIS — M542 Cervicalgia: Secondary | ICD-10-CM | POA: Diagnosis not present

## 2016-08-27 DIAGNOSIS — M9901 Segmental and somatic dysfunction of cervical region: Secondary | ICD-10-CM | POA: Diagnosis not present

## 2016-09-01 DIAGNOSIS — M545 Low back pain: Secondary | ICD-10-CM | POA: Diagnosis not present

## 2016-09-01 DIAGNOSIS — M9903 Segmental and somatic dysfunction of lumbar region: Secondary | ICD-10-CM | POA: Diagnosis not present

## 2016-09-01 DIAGNOSIS — M9901 Segmental and somatic dysfunction of cervical region: Secondary | ICD-10-CM | POA: Diagnosis not present

## 2016-09-01 DIAGNOSIS — M542 Cervicalgia: Secondary | ICD-10-CM | POA: Diagnosis not present

## 2016-09-03 DIAGNOSIS — M9903 Segmental and somatic dysfunction of lumbar region: Secondary | ICD-10-CM | POA: Diagnosis not present

## 2016-09-03 DIAGNOSIS — M542 Cervicalgia: Secondary | ICD-10-CM | POA: Diagnosis not present

## 2016-09-03 DIAGNOSIS — M9901 Segmental and somatic dysfunction of cervical region: Secondary | ICD-10-CM | POA: Diagnosis not present

## 2016-09-03 DIAGNOSIS — M545 Low back pain: Secondary | ICD-10-CM | POA: Diagnosis not present

## 2016-09-09 DIAGNOSIS — M545 Low back pain: Secondary | ICD-10-CM | POA: Diagnosis not present

## 2016-09-09 DIAGNOSIS — M542 Cervicalgia: Secondary | ICD-10-CM | POA: Diagnosis not present

## 2016-09-09 DIAGNOSIS — M9903 Segmental and somatic dysfunction of lumbar region: Secondary | ICD-10-CM | POA: Diagnosis not present

## 2016-09-09 DIAGNOSIS — M9901 Segmental and somatic dysfunction of cervical region: Secondary | ICD-10-CM | POA: Diagnosis not present

## 2016-09-10 DIAGNOSIS — M542 Cervicalgia: Secondary | ICD-10-CM | POA: Diagnosis not present

## 2016-09-10 DIAGNOSIS — M9901 Segmental and somatic dysfunction of cervical region: Secondary | ICD-10-CM | POA: Diagnosis not present

## 2016-09-10 DIAGNOSIS — M9903 Segmental and somatic dysfunction of lumbar region: Secondary | ICD-10-CM | POA: Diagnosis not present

## 2016-09-10 DIAGNOSIS — M545 Low back pain: Secondary | ICD-10-CM | POA: Diagnosis not present

## 2016-09-16 DIAGNOSIS — M545 Low back pain: Secondary | ICD-10-CM | POA: Diagnosis not present

## 2016-09-16 DIAGNOSIS — M9903 Segmental and somatic dysfunction of lumbar region: Secondary | ICD-10-CM | POA: Diagnosis not present

## 2016-09-16 DIAGNOSIS — M542 Cervicalgia: Secondary | ICD-10-CM | POA: Diagnosis not present

## 2016-09-16 DIAGNOSIS — M9901 Segmental and somatic dysfunction of cervical region: Secondary | ICD-10-CM | POA: Diagnosis not present

## 2016-09-17 DIAGNOSIS — M9903 Segmental and somatic dysfunction of lumbar region: Secondary | ICD-10-CM | POA: Diagnosis not present

## 2016-09-17 DIAGNOSIS — M9901 Segmental and somatic dysfunction of cervical region: Secondary | ICD-10-CM | POA: Diagnosis not present

## 2016-09-17 DIAGNOSIS — M545 Low back pain: Secondary | ICD-10-CM | POA: Diagnosis not present

## 2016-09-17 DIAGNOSIS — M542 Cervicalgia: Secondary | ICD-10-CM | POA: Diagnosis not present

## 2016-09-22 DIAGNOSIS — M542 Cervicalgia: Secondary | ICD-10-CM | POA: Diagnosis not present

## 2016-09-22 DIAGNOSIS — M9901 Segmental and somatic dysfunction of cervical region: Secondary | ICD-10-CM | POA: Diagnosis not present

## 2016-09-22 DIAGNOSIS — M545 Low back pain: Secondary | ICD-10-CM | POA: Diagnosis not present

## 2016-09-22 DIAGNOSIS — M9903 Segmental and somatic dysfunction of lumbar region: Secondary | ICD-10-CM | POA: Diagnosis not present

## 2016-09-29 DIAGNOSIS — M9901 Segmental and somatic dysfunction of cervical region: Secondary | ICD-10-CM | POA: Diagnosis not present

## 2016-09-29 DIAGNOSIS — M545 Low back pain: Secondary | ICD-10-CM | POA: Diagnosis not present

## 2016-09-29 DIAGNOSIS — M9903 Segmental and somatic dysfunction of lumbar region: Secondary | ICD-10-CM | POA: Diagnosis not present

## 2016-09-29 DIAGNOSIS — M542 Cervicalgia: Secondary | ICD-10-CM | POA: Diagnosis not present

## 2016-10-01 DIAGNOSIS — M542 Cervicalgia: Secondary | ICD-10-CM | POA: Diagnosis not present

## 2016-10-01 DIAGNOSIS — M545 Low back pain: Secondary | ICD-10-CM | POA: Diagnosis not present

## 2016-10-01 DIAGNOSIS — M9901 Segmental and somatic dysfunction of cervical region: Secondary | ICD-10-CM | POA: Diagnosis not present

## 2016-10-01 DIAGNOSIS — M9903 Segmental and somatic dysfunction of lumbar region: Secondary | ICD-10-CM | POA: Diagnosis not present

## 2016-10-13 DIAGNOSIS — M542 Cervicalgia: Secondary | ICD-10-CM | POA: Diagnosis not present

## 2016-10-13 DIAGNOSIS — M545 Low back pain: Secondary | ICD-10-CM | POA: Diagnosis not present

## 2016-10-13 DIAGNOSIS — M9901 Segmental and somatic dysfunction of cervical region: Secondary | ICD-10-CM | POA: Diagnosis not present

## 2016-10-13 DIAGNOSIS — M9903 Segmental and somatic dysfunction of lumbar region: Secondary | ICD-10-CM | POA: Diagnosis not present

## 2016-10-15 DIAGNOSIS — M542 Cervicalgia: Secondary | ICD-10-CM | POA: Diagnosis not present

## 2016-10-15 DIAGNOSIS — M9903 Segmental and somatic dysfunction of lumbar region: Secondary | ICD-10-CM | POA: Diagnosis not present

## 2016-10-15 DIAGNOSIS — M545 Low back pain: Secondary | ICD-10-CM | POA: Diagnosis not present

## 2016-10-15 DIAGNOSIS — M9901 Segmental and somatic dysfunction of cervical region: Secondary | ICD-10-CM | POA: Diagnosis not present

## 2016-11-02 DIAGNOSIS — L720 Epidermal cyst: Secondary | ICD-10-CM | POA: Diagnosis not present

## 2016-11-02 DIAGNOSIS — D225 Melanocytic nevi of trunk: Secondary | ICD-10-CM | POA: Diagnosis not present

## 2016-11-02 DIAGNOSIS — L821 Other seborrheic keratosis: Secondary | ICD-10-CM | POA: Diagnosis not present

## 2016-11-10 DIAGNOSIS — L720 Epidermal cyst: Secondary | ICD-10-CM | POA: Diagnosis not present

## 2016-11-13 ENCOUNTER — Emergency Department (HOSPITAL_COMMUNITY): Payer: Medicare HMO

## 2016-11-13 ENCOUNTER — Encounter (HOSPITAL_COMMUNITY): Payer: Self-pay

## 2016-11-13 ENCOUNTER — Emergency Department (HOSPITAL_COMMUNITY)
Admission: EM | Admit: 2016-11-13 | Discharge: 2016-11-14 | Disposition: A | Discharge status: Home / Self Care | Payer: Medicare HMO | Attending: Emergency Medicine | Admitting: Emergency Medicine

## 2016-11-13 DIAGNOSIS — R079 Chest pain, unspecified: Secondary | ICD-10-CM | POA: Diagnosis not present

## 2016-11-13 DIAGNOSIS — Z5321 Procedure and treatment not carried out due to patient leaving prior to being seen by health care provider: Secondary | ICD-10-CM | POA: Insufficient documentation

## 2016-11-13 LAB — BASIC METABOLIC PANEL
Anion gap: 9 (ref 5–15)
BUN: 17 mg/dL (ref 6–20)
CO2: 23 mmol/L (ref 22–32)
Calcium: 9.3 mg/dL (ref 8.9–10.3)
Chloride: 108 mmol/L (ref 101–111)
Creatinine, Ser: 0.87 mg/dL (ref 0.44–1.00)
GFR calc Af Amer: >60 mL/min (ref >60)
Glucose, Bld: 100 mg/dL — ABNORMAL HIGH (ref 65–99)
POTASSIUM: 3.8 mmol/L (ref 3.5–5.1)
Sodium: 140 mmol/L (ref 135–145)

## 2016-11-13 LAB — CBC
HEMATOCRIT: 42.7 % (ref 36.0–46.0)
Hemoglobin: 14.2 g/dL (ref 12.0–15.0)
MCH: 29 pg (ref 26.0–34.0)
MCHC: 33.3 g/dL (ref 30.0–36.0)
MCV: 87.3 fL (ref 78.0–100.0)
Platelets: 258 10*3/uL (ref 150–400)
RBC: 4.89 MIL/uL (ref 3.87–5.11)
RDW: 14.3 % (ref 11.5–15.5)
WBC: 11.4 10*3/uL — ABNORMAL HIGH (ref 4.0–10.5)

## 2016-11-13 LAB — I-STAT TROPONIN, ED: Troponin i, poc: 0 ng/mL (ref 0.00–0.08)

## 2016-11-13 IMAGING — DX DG CHEST 2V
2 series · 2 of 2 positions shown · non-contrast
Comparison: None.

CLINICAL DATA: Central chest pain radiating to the left arm and jaw

EXAM:
CHEST  2 VIEW

[chest pa]
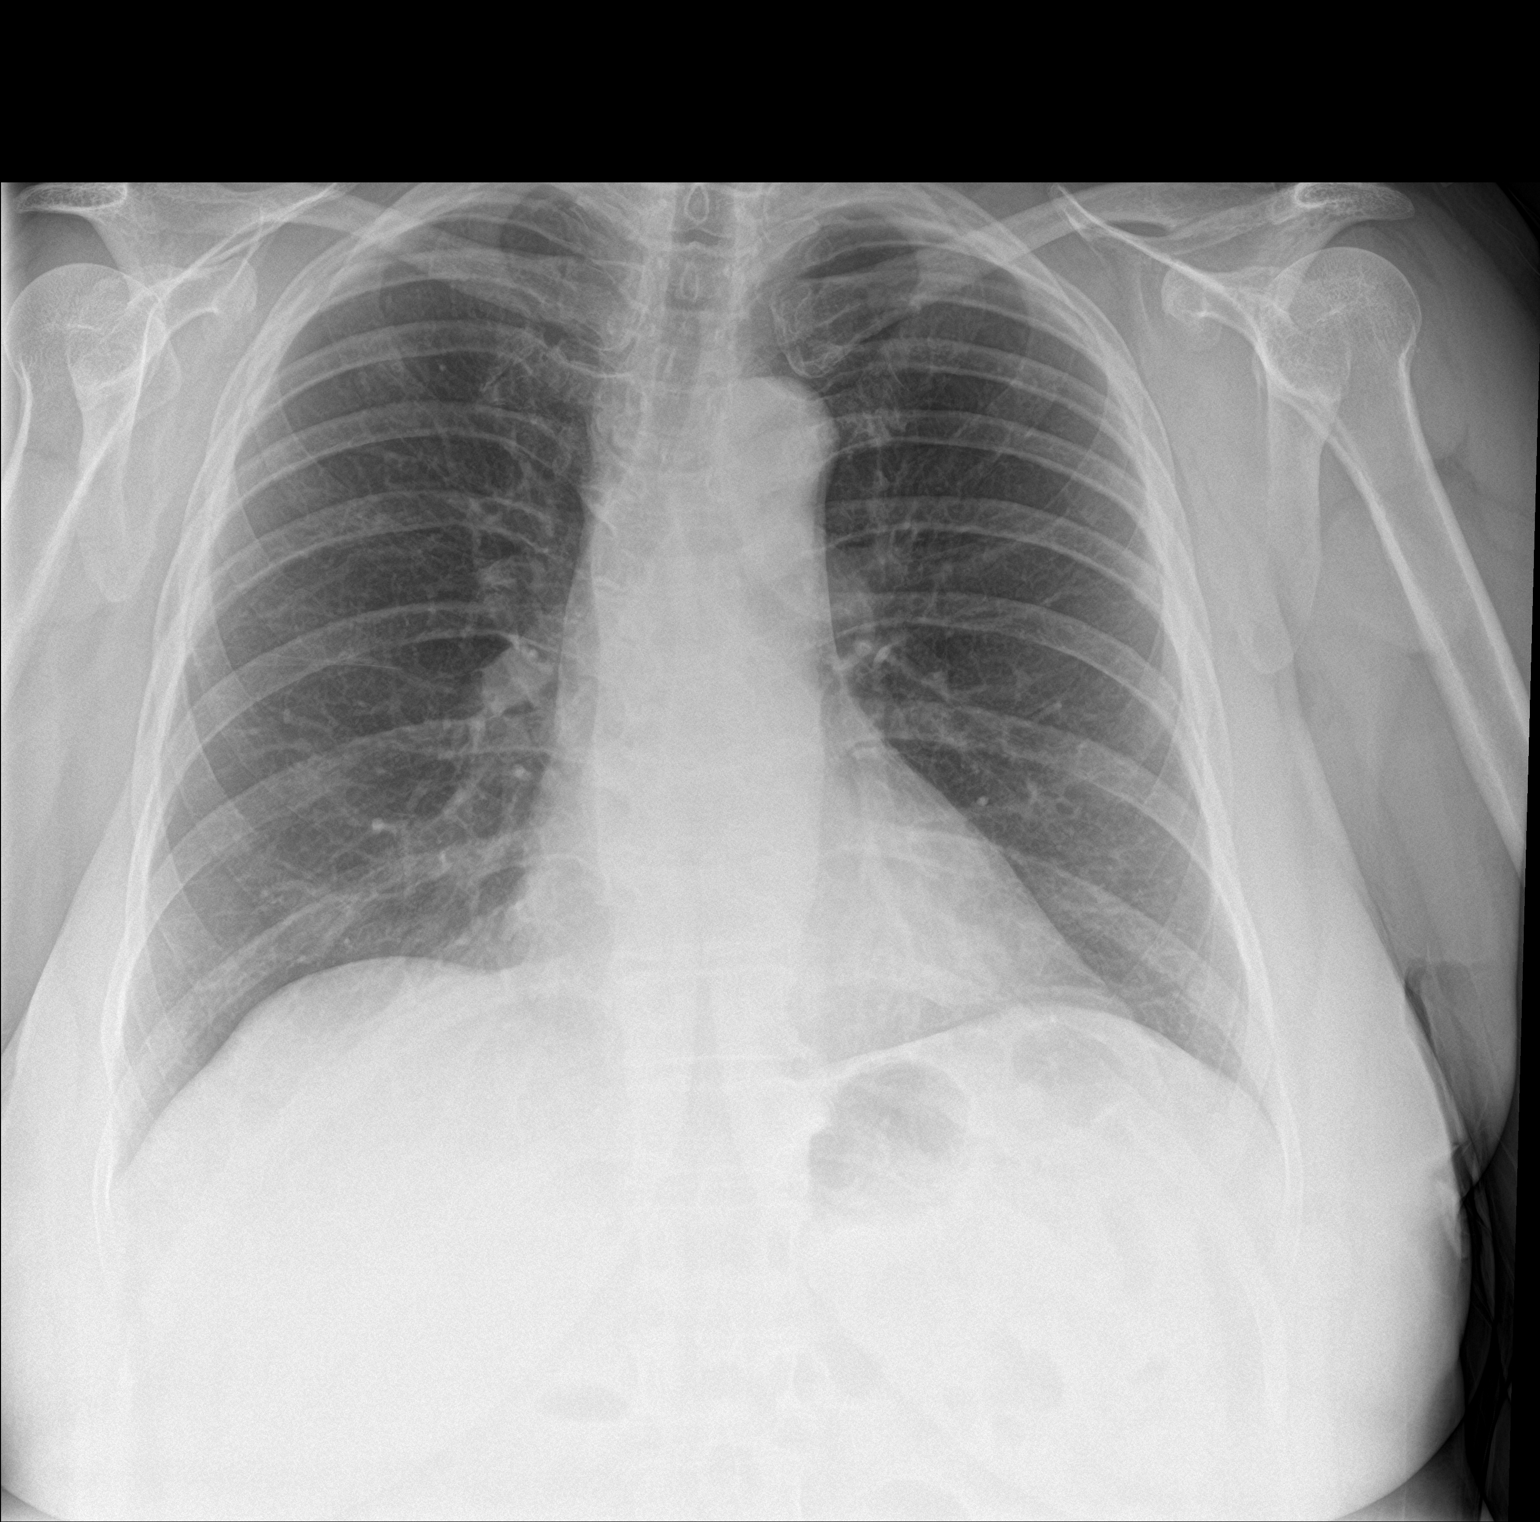

[chest lat]
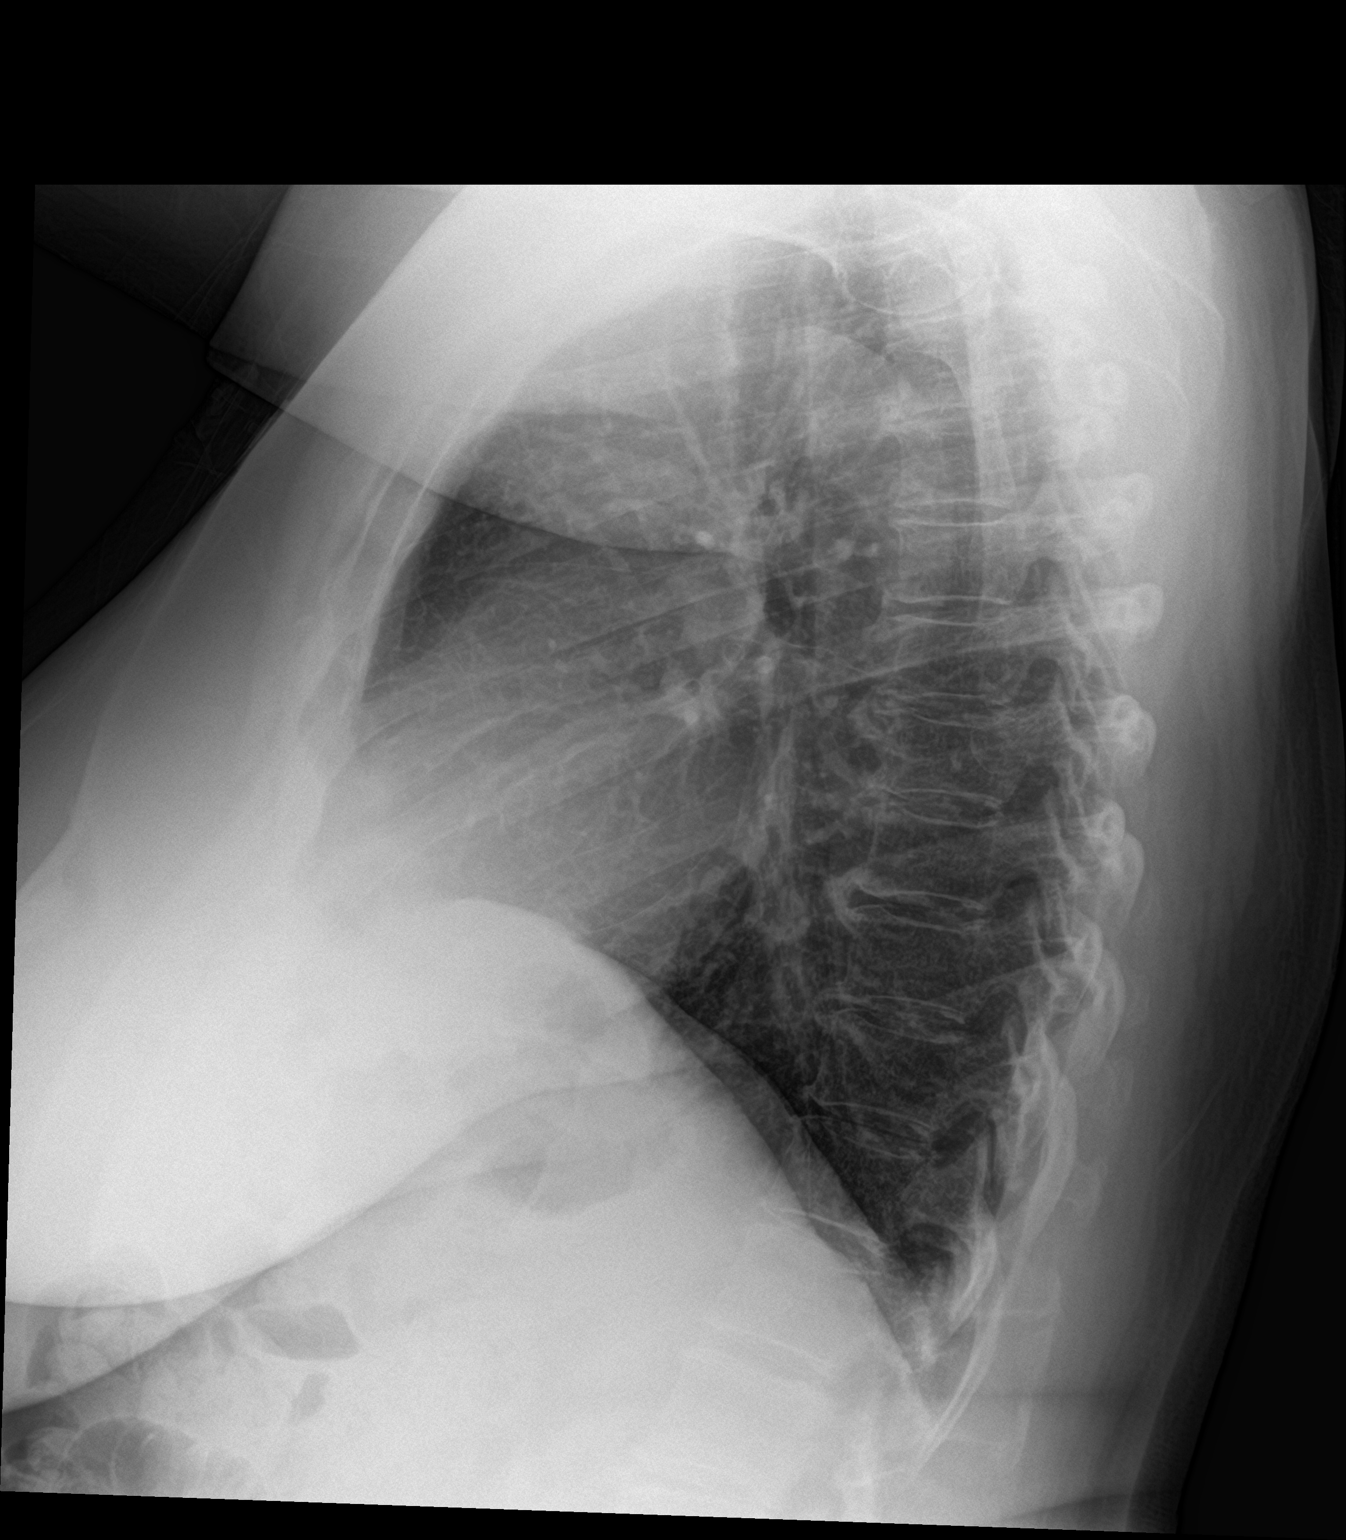

[2 of 2 positions shown; findings below may reference images not displayed]

FINDINGS: The heart size and mediastinal contours are within normal limits.
Both lungs are clear. The visualized skeletal structures are
unremarkable.
IMPRESSION: No active cardiopulmonary disease.

## 2016-11-13 NOTE — ED Triage Notes (Signed)
Pt presents to the ed with central chest pain that radiates into her left arm and jaw. Pt denies any other symptoms. Pt is in no apparent distress in triage.

## 2016-11-13 NOTE — ED Notes (Signed)
Patient called x3, over about 15 minutes, no answer.

## 2017-02-23 DIAGNOSIS — G4733 Obstructive sleep apnea (adult) (pediatric): Secondary | ICD-10-CM | POA: Diagnosis not present

## 2017-02-26 DIAGNOSIS — J45909 Unspecified asthma, uncomplicated: Secondary | ICD-10-CM | POA: Diagnosis not present

## 2017-05-26 DIAGNOSIS — G4733 Obstructive sleep apnea (adult) (pediatric): Secondary | ICD-10-CM | POA: Diagnosis not present

## 2017-06-21 DIAGNOSIS — H25013 Cortical age-related cataract, bilateral: Secondary | ICD-10-CM | POA: Diagnosis not present

## 2017-06-21 DIAGNOSIS — H35371 Puckering of macula, right eye: Secondary | ICD-10-CM | POA: Diagnosis not present

## 2017-06-21 DIAGNOSIS — H16223 Keratoconjunctivitis sicca, not specified as Sjogren's, bilateral: Secondary | ICD-10-CM | POA: Diagnosis not present

## 2017-08-19 DIAGNOSIS — M9903 Segmental and somatic dysfunction of lumbar region: Secondary | ICD-10-CM | POA: Diagnosis not present

## 2017-08-19 DIAGNOSIS — M9901 Segmental and somatic dysfunction of cervical region: Secondary | ICD-10-CM | POA: Diagnosis not present

## 2017-08-19 DIAGNOSIS — M542 Cervicalgia: Secondary | ICD-10-CM | POA: Diagnosis not present

## 2017-08-19 DIAGNOSIS — M545 Low back pain: Secondary | ICD-10-CM | POA: Diagnosis not present

## 2017-08-24 DIAGNOSIS — G4733 Obstructive sleep apnea (adult) (pediatric): Secondary | ICD-10-CM | POA: Diagnosis not present

## 2017-08-24 DIAGNOSIS — M9901 Segmental and somatic dysfunction of cervical region: Secondary | ICD-10-CM | POA: Diagnosis not present

## 2017-08-24 DIAGNOSIS — M542 Cervicalgia: Secondary | ICD-10-CM | POA: Diagnosis not present

## 2017-08-24 DIAGNOSIS — M545 Low back pain: Secondary | ICD-10-CM | POA: Diagnosis not present

## 2017-08-24 DIAGNOSIS — M9903 Segmental and somatic dysfunction of lumbar region: Secondary | ICD-10-CM | POA: Diagnosis not present

## 2017-08-30 DIAGNOSIS — M9903 Segmental and somatic dysfunction of lumbar region: Secondary | ICD-10-CM | POA: Diagnosis not present

## 2017-08-30 DIAGNOSIS — M542 Cervicalgia: Secondary | ICD-10-CM | POA: Diagnosis not present

## 2017-08-30 DIAGNOSIS — M9901 Segmental and somatic dysfunction of cervical region: Secondary | ICD-10-CM | POA: Diagnosis not present

## 2017-08-30 DIAGNOSIS — M545 Low back pain: Secondary | ICD-10-CM | POA: Diagnosis not present

## 2017-09-06 DIAGNOSIS — M9901 Segmental and somatic dysfunction of cervical region: Secondary | ICD-10-CM | POA: Diagnosis not present

## 2017-09-06 DIAGNOSIS — M9903 Segmental and somatic dysfunction of lumbar region: Secondary | ICD-10-CM | POA: Diagnosis not present

## 2017-09-06 DIAGNOSIS — M545 Low back pain: Secondary | ICD-10-CM | POA: Diagnosis not present

## 2017-09-06 DIAGNOSIS — M542 Cervicalgia: Secondary | ICD-10-CM | POA: Diagnosis not present

## 2017-09-13 DIAGNOSIS — M9903 Segmental and somatic dysfunction of lumbar region: Secondary | ICD-10-CM | POA: Diagnosis not present

## 2017-09-13 DIAGNOSIS — M9901 Segmental and somatic dysfunction of cervical region: Secondary | ICD-10-CM | POA: Diagnosis not present

## 2017-09-13 DIAGNOSIS — M542 Cervicalgia: Secondary | ICD-10-CM | POA: Diagnosis not present

## 2017-09-13 DIAGNOSIS — M545 Low back pain: Secondary | ICD-10-CM | POA: Diagnosis not present

## 2017-09-20 DIAGNOSIS — M542 Cervicalgia: Secondary | ICD-10-CM | POA: Diagnosis not present

## 2017-09-20 DIAGNOSIS — M9903 Segmental and somatic dysfunction of lumbar region: Secondary | ICD-10-CM | POA: Diagnosis not present

## 2017-09-20 DIAGNOSIS — M9901 Segmental and somatic dysfunction of cervical region: Secondary | ICD-10-CM | POA: Diagnosis not present

## 2017-09-20 DIAGNOSIS — M545 Low back pain: Secondary | ICD-10-CM | POA: Diagnosis not present

## 2018-02-27 DIAGNOSIS — G4733 Obstructive sleep apnea (adult) (pediatric): Secondary | ICD-10-CM | POA: Diagnosis not present

## 2018-05-30 DIAGNOSIS — G4733 Obstructive sleep apnea (adult) (pediatric): Secondary | ICD-10-CM | POA: Diagnosis not present

## 2018-08-29 DIAGNOSIS — G4733 Obstructive sleep apnea (adult) (pediatric): Secondary | ICD-10-CM | POA: Diagnosis not present

## 2018-09-12 DIAGNOSIS — M545 Low back pain: Secondary | ICD-10-CM | POA: Diagnosis not present

## 2018-09-14 DIAGNOSIS — S39012D Strain of muscle, fascia and tendon of lower back, subsequent encounter: Secondary | ICD-10-CM | POA: Diagnosis not present

## 2018-09-19 DIAGNOSIS — S39012D Strain of muscle, fascia and tendon of lower back, subsequent encounter: Secondary | ICD-10-CM | POA: Diagnosis not present

## 2018-09-22 DIAGNOSIS — S39012D Strain of muscle, fascia and tendon of lower back, subsequent encounter: Secondary | ICD-10-CM | POA: Diagnosis not present

## 2018-09-27 DIAGNOSIS — S39012D Strain of muscle, fascia and tendon of lower back, subsequent encounter: Secondary | ICD-10-CM | POA: Diagnosis not present

## 2018-10-04 DIAGNOSIS — S39012D Strain of muscle, fascia and tendon of lower back, subsequent encounter: Secondary | ICD-10-CM | POA: Diagnosis not present

## 2018-10-06 DIAGNOSIS — S39012D Strain of muscle, fascia and tendon of lower back, subsequent encounter: Secondary | ICD-10-CM | POA: Diagnosis not present

## 2018-10-12 DIAGNOSIS — S39012D Strain of muscle, fascia and tendon of lower back, subsequent encounter: Secondary | ICD-10-CM | POA: Diagnosis not present

## 2018-10-25 DIAGNOSIS — S39012D Strain of muscle, fascia and tendon of lower back, subsequent encounter: Secondary | ICD-10-CM | POA: Diagnosis not present

## 2019-03-01 DIAGNOSIS — G4733 Obstructive sleep apnea (adult) (pediatric): Secondary | ICD-10-CM | POA: Diagnosis not present

## 2019-03-21 DIAGNOSIS — H5203 Hypermetropia, bilateral: Secondary | ICD-10-CM | POA: Diagnosis not present

## 2019-05-29 ENCOUNTER — Ambulatory Visit: Payer: Medicare HMO | Attending: Internal Medicine

## 2019-05-29 DIAGNOSIS — Z23 Encounter for immunization: Secondary | ICD-10-CM | POA: Insufficient documentation

## 2019-05-29 NOTE — Progress Notes (Signed)
   Covid-19 Vaccination Clinic  Name:  Jaqulyn Skurka    MRN: [id number] DOB: [birth year]  05/29/2019  Ms. Prosise was observed post Covid-19 immunization for 15 minutes without incidence. She was provided with Vaccine Information Sheet and instruction to access the V-Safe system.   Ms. Burki was instructed to call 911 with any severe reactions post vaccine: Marland Kitchen Difficulty breathing  . Swelling of your face and throat  . A fast heartbeat  . A bad rash all over your body  . Dizziness and weakness    Immunizations Administered    Name Date Dose VIS Date Route   Pfizer COVID-19 Vaccine 05/29/2019 10:04 AM 0.3 mL 03/10/2019 Intramuscular   Manufacturer: [place]   Lot: [id number]   [place]: [id number]

## 2019-06-08 DIAGNOSIS — G4733 Obstructive sleep apnea (adult) (pediatric): Secondary | ICD-10-CM | POA: Diagnosis not present

## 2019-06-27 ENCOUNTER — Ambulatory Visit: Payer: Medicare HMO | Attending: Internal Medicine

## 2019-06-27 DIAGNOSIS — Z23 Encounter for immunization: Secondary | ICD-10-CM

## 2019-06-27 NOTE — Progress Notes (Signed)
   Covid-19 Vaccination Clinic  Name:  Fredie Gudmundson    MRN: [id number] DOB: 04-Feb-1946  06/27/2019  Ms. Wiegel was observed post Covid-19 immunization for 15 minutes without incident. She was provided with Vaccine Information Sheet and instruction to access the V-Safe system.   Ms. Notte was instructed to call 911 with any severe reactions post vaccine: Marland Kitchen Difficulty breathing  . Swelling of face and throat  . A fast heartbeat  . A bad rash all over body  . Dizziness and weakness   Immunizations Administered    Name Date Dose VIS Date Route   Pfizer COVID-19 Vaccine 06/27/2019  9:44 AM 0.3 mL 03/10/2019 Intramuscular   Manufacturer: [place]   Lot: [id number]   [place]: [id number]

## 2019-08-30 DIAGNOSIS — G4733 Obstructive sleep apnea (adult) (pediatric): Secondary | ICD-10-CM | POA: Diagnosis not present

## 2020-02-29 DIAGNOSIS — G4733 Obstructive sleep apnea (adult) (pediatric): Secondary | ICD-10-CM | POA: Diagnosis not present

## 2020-03-26 DIAGNOSIS — H5203 Hypermetropia, bilateral: Secondary | ICD-10-CM | POA: Diagnosis not present

## 2020-03-26 DIAGNOSIS — Z0100 Encounter for examination of eyes and vision without abnormal findings: Secondary | ICD-10-CM | POA: Diagnosis not present

## 2020-03-26 DIAGNOSIS — H25013 Cortical age-related cataract, bilateral: Secondary | ICD-10-CM | POA: Diagnosis not present

## 2020-04-09 DIAGNOSIS — U071 COVID-19: Secondary | ICD-10-CM | POA: Diagnosis not present

## 2020-04-09 DIAGNOSIS — Z20822 Contact with and (suspected) exposure to covid-19: Secondary | ICD-10-CM | POA: Diagnosis not present

## 2020-06-28 ENCOUNTER — Other Ambulatory Visit: Payer: Self-pay

## 2020-06-28 ENCOUNTER — Ambulatory Visit (INDEPENDENT_AMBULATORY_CARE_PROVIDER_SITE_OTHER): Payer: Medicare HMO | Admitting: Physician Assistant

## 2020-06-28 ENCOUNTER — Encounter: Payer: Self-pay | Admitting: Physician Assistant

## 2020-06-28 VITALS — BP 140/90 | HR 69 | Temp 98.4°F | Ht 64.0 in | Wt 235.2 lb

## 2020-06-28 DIAGNOSIS — F339 Major depressive disorder, recurrent, unspecified: Secondary | ICD-10-CM | POA: Insufficient documentation

## 2020-06-28 DIAGNOSIS — E559 Vitamin D deficiency, unspecified: Secondary | ICD-10-CM

## 2020-06-28 DIAGNOSIS — Z136 Encounter for screening for cardiovascular disorders: Secondary | ICD-10-CM

## 2020-06-28 DIAGNOSIS — F419 Anxiety disorder, unspecified: Secondary | ICD-10-CM

## 2020-06-28 DIAGNOSIS — L659 Nonscarring hair loss, unspecified: Secondary | ICD-10-CM | POA: Diagnosis not present

## 2020-06-28 DIAGNOSIS — M1990 Unspecified osteoarthritis, unspecified site: Secondary | ICD-10-CM | POA: Insufficient documentation

## 2020-06-28 DIAGNOSIS — R5383 Other fatigue: Secondary | ICD-10-CM | POA: Diagnosis not present

## 2020-06-28 DIAGNOSIS — Z13220 Encounter for screening for lipoid disorders: Secondary | ICD-10-CM

## 2020-06-28 DIAGNOSIS — M19049 Primary osteoarthritis, unspecified hand: Secondary | ICD-10-CM | POA: Insufficient documentation

## 2020-06-28 DIAGNOSIS — F332 Major depressive disorder, recurrent severe without psychotic features: Secondary | ICD-10-CM | POA: Diagnosis not present

## 2020-06-28 DIAGNOSIS — G4733 Obstructive sleep apnea (adult) (pediatric): Secondary | ICD-10-CM | POA: Insufficient documentation

## 2020-06-28 DIAGNOSIS — H269 Unspecified cataract: Secondary | ICD-10-CM | POA: Insufficient documentation

## 2020-06-28 DIAGNOSIS — Z6841 Body Mass Index (BMI) 40.0 and over, adult: Secondary | ICD-10-CM | POA: Diagnosis not present

## 2020-06-28 LAB — CBC WITH DIFFERENTIAL/PLATELET
Basophils Absolute: 0 10*3/uL (ref 0.0–0.1)
Basophils Relative: 0.5 % (ref 0.0–3.0)
Eosinophils Absolute: 0.1 10*3/uL (ref 0.0–0.7)
Eosinophils Relative: 1.3 % (ref 0.0–5.0)
HCT: 42 % (ref 36.0–46.0)
Hemoglobin: 14.2 g/dL (ref 12.0–15.0)
Lymphocytes Relative: 39.2 % (ref 12.0–46.0)
Lymphs Abs: 3.7 10*3/uL (ref 0.7–4.0)
MCHC: 33.8 g/dL (ref 30.0–36.0)
MCV: 87.8 fl (ref 78.0–100.0)
Monocytes Absolute: 0.6 10*3/uL (ref 0.1–1.0)
Monocytes Relative: 6.4 % (ref 3.0–12.0)
Neutro Abs: 4.9 10*3/uL (ref 1.4–7.7)
Neutrophils Relative %: 52.6 % (ref 43.0–77.0)
Platelets: 244 10*3/uL (ref 150.0–400.0)
RBC: 4.78 Mil/uL (ref 3.87–5.11)
RDW: 13.9 % (ref 11.5–15.5)
WBC: 9.3 10*3/uL (ref 4.0–10.5)

## 2020-06-28 LAB — LIPID PANEL
Cholesterol: 209 mg/dL — ABNORMAL HIGH (ref 0–200)
HDL: 49.5 mg/dL (ref >39.00)
NonHDL: 159.71
Total CHOL/HDL Ratio: 4
Triglycerides: 267 mg/dL — ABNORMAL HIGH (ref 0.0–149.0)
VLDL: 53.4 mg/dL — ABNORMAL HIGH (ref 0.0–40.0)

## 2020-06-28 LAB — IRON: Iron: 83 ug/dL (ref 42–145)

## 2020-06-28 LAB — TSH: TSH: 2.21 u[IU]/mL (ref 0.35–4.50)

## 2020-06-28 LAB — COMPREHENSIVE METABOLIC PANEL
ALT: 34 U/L (ref 0–35)
AST: 26 U/L (ref 0–37)
Albumin: 4.7 g/dL (ref 3.5–5.2)
Alkaline Phosphatase: 50 U/L (ref 39–117)
BUN: 17 mg/dL (ref 6–23)
CO2: 27 mEq/L (ref 19–32)
Calcium: 9.6 mg/dL (ref 8.4–10.5)
Chloride: 105 mEq/L (ref 96–112)
Creatinine, Ser: 0.72 mg/dL (ref 0.40–1.20)
GFR: 83.04 mL/min (ref >60.00)
Glucose, Bld: 104 mg/dL — ABNORMAL HIGH (ref 70–99)
Potassium: 4.2 mEq/L (ref 3.5–5.1)
Sodium: 141 mEq/L (ref 135–145)
Total Bilirubin: 0.4 mg/dL (ref 0.2–1.2)
Total Protein: 7 g/dL (ref 6.0–8.3)

## 2020-06-28 LAB — FERRITIN: Ferritin: 237.4 ng/mL (ref 10.0–291.0)

## 2020-06-28 LAB — VITAMIN B12: Vitamin B-12: 417 pg/mL (ref 211–911)

## 2020-06-28 LAB — VITAMIN D 25 HYDROXY (VIT D DEFICIENCY, FRACTURES): VITD: 18.7 ng/mL — ABNORMAL LOW (ref 30.00–100.00)

## 2020-06-28 LAB — LDL CHOLESTEROL, DIRECT: Direct LDL: 117 mg/dL

## 2020-06-28 MED ORDER — SERTRALINE HCL 25 MG PO TABS: 25.0000 mg | ORAL_TABLET | Freq: Every day | ORAL | 3 refills | Status: DC

## 2020-06-28 NOTE — Progress Notes (Signed)
Sherri Smith is a 75 y.o. female here for a new problem.  History of Present Illness:   Chief Complaint  Patient presents with  . Annual Exam    Has not been to the doctor in awhile, has a phobia of doctors offices.     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 She takes care of her a few days of week and feels significant difficulty with helping with this.  She feels like she has lost herself while trying to help out her granddaughter.  She is also finding that she is more irritable, has difficulty sleeping, issues with communicating with her husband and daughter.  She does not have friends here, had a good friend group that she left when she moved here 8 years ago. Has an appointment with a counselor later this month.  Depression screen PHQ 2/9 06/28/2020  Decreased Interest 2  Down, Depressed, Hopeless 2  PHQ - 2 Score 4  Altered sleeping 2  Tired, decreased energy 3  Change in appetite 2  Feeling bad or failure about yourself  3  Trouble concentrating 3  Moving slowly or fidgety/restless 2  Suicidal thoughts 1  PHQ-9 Score 20  Difficult doing work/chores Very difficult   Having suicidal thoughts but no plan at this time. Briefly in her 20's was on antidepressant. Has seen counselors in the past as well.  She is not sure why she stopped medication or what medication was she was on.   Hair loss Has had hair loss on her scalp and hair growth on her face.  She is not sure if this is due to health changes or hormonal issues.  She is very self-conscious of the hair on her face.  Denies any prior history of polycystic ovarian syndrome based on her recollection.  She does take Coricidin, zinc, vitamin C and a general multivitamin.   Fatigue She is not sure if this is due to her mood.  She has been having issues with fatigue that is causing significant ability to feel motivated to do much.  She is  exercising on her stationary bike 4-5 times a week for 20 minutes.  Denies chest pain but does get shortness of breath with this, she feels like this is from deconditioning.  She drinks about 1 glass of wine per day.  She has not had updated blood work in quite some time.  She does use a CPAP regularly.     History reviewed. No pertinent past medical history.   Social History   Tobacco Use  . Smoking status: Never Smoker  . Smokeless tobacco: Never Used  Vaping Use  . Vaping Use: Never used  Substance Use Topics  . Alcohol use: Yes    Alcohol/week: 4.0 standard drinks    Types: 4 Glasses of wine per week    Comment: one glass 5-6x/week    Past Surgical History:  Procedure Laterality Date  . APPENDECTOMY    . OVARIAN CYST REMOVAL     1972    Family History  Problem Relation Age of Onset  . Breast cancer Mother   . Osteoarthritis Mother   . Depression Mother   . Heart disease Mother   . Prostate cancer Father   . Osteoarthritis Father   . Hyperlipidemia Father   . Hypertension Father   . Heart disease Maternal Grandmother   . Heart disease Maternal Grandfather     No  Known Allergies  Current Medications:   Current Outpatient Medications:  Marland Kitchen  Multiple Vitamins-Minerals (MULTIVITAMIN WITH MINERALS) tablet, Take 1 tablet by mouth every evening., Disp: , Rfl:  .  QUERCETIN PO, Take by mouth daily., Disp: , Rfl:  .  sertraline (ZOLOFT) 25 MG tablet, Take 1 tablet (25 mg total) by mouth daily., Disp: 30 tablet, Rfl: 3 .  ZINC-VITAMIN C PO, Take by mouth daily., Disp: , Rfl:    Review of Systems:   ROS Negative unless otherwise specified per HPI.  Vitals:   Vitals:   06/28/20 1112  BP: 140/90  Pulse: 69  Temp: 98.4 F (36.9 C)  TempSrc: Temporal  SpO2: 96%  Weight: 235 lb 3.2 oz (106.7 kg)  Height: 5\' 4"  (1.626 m)     Body mass index is 40.37 kg/m.  Physical Exam:   Physical Exam Vitals and nursing note reviewed.  Constitutional:      General: She  is not in acute distress.    Appearance: She is well-developed. She is not ill-appearing or toxic-appearing.  Cardiovascular:     Rate and Rhythm: Normal rate and regular rhythm.     Pulses: Normal pulses.     Heart sounds: Normal heart sounds, S1 normal and S2 normal.     Comments: No LE edema Pulmonary:     Effort: Pulmonary effort is normal.     Breath sounds: Normal breath sounds.  Skin:    General: Skin is warm and dry.  Neurological:     Mental Status: She is alert.     GCS: GCS eye subscore is 4. GCS verbal subscore is 5. GCS motor subscore is 6.  Psychiatric:        Attention and Perception: Attention normal.        Mood and Affect: Mood is depressed. Affect is tearful.        Speech: Speech normal.        Behavior: Behavior normal. Behavior is cooperative.      Assessment and Plan:   Sherri Smith was seen today for annual exam.  Diagnoses and all orders for this visit:  Anxiety; Severe episode of recurrent major depressive disorder, without psychotic features ([place]) Uncontrolled. Denies current suicidal plan.  Agreeable to starting Zoloft 25 mg daily. I discussed with patient that if they develop any SI, to tell someone immediately and seek medical attention. Follow-up in 1 month, sooner if concerns. Agree with seeing talk therapist. -     CBC with Differential/Platelet -     Comprehensive metabolic panel  Fatigue, unspecified type Suspect multifactorial. Will update blood work today and make sure there are no organic causes of this. Continue to work on mental health and use CPAP. Follow-up in 1 month. -     TSH -     Vitamin B12  Hair thinning Unclear etiology, will update thyroid and iron levels. Did discuss that this could be related to stress. Continue to monitor.  May need to send to dermatology. -     Iron -     Cancel: Ferritin -     Ferritin  Vitamin D deficiency Update vitamin D level and provide recommendations accordingly. -     VITAMIN D 25  Hydroxy (Vit-D Deficiency, Fractures)  Encounter for lipid screening for cardiovascular disease -     Lipid panel  Other orders -     sertraline (ZOLOFT) 25 MG tablet; Take 1 tablet (25 mg total) by mouth daily.  Time spent with patient today was  45 minutes which consisted of chart review, discussing diagnosis, work up, treatment answering questions and documentation.  [name], PA-C

## 2020-06-28 NOTE — Patient Instructions (Signed)
It was great to see you!  Update blood work today  Start zoloft 25 mg Follow-up in 1 month regarding this  We will be in touch with your blood work results and plan!  If you develop suicidal thoughts, please tell someone and immediately proceed to our local 24/7 crisis center, [facility] at the [facility] [address], [place], [place] [address] [phone].   Take care,  [name] PA-C

## 2020-07-01 ENCOUNTER — Encounter: Payer: Self-pay | Admitting: Physician Assistant

## 2020-07-01 MED ORDER — ATORVASTATIN CALCIUM 20 MG PO TABS: 20.0000 mg | ORAL_TABLET | Freq: Every day | ORAL | 0 refills | Status: DC

## 2020-07-01 NOTE — Telephone Encounter (Signed)
Spoke to pt told her calling to clarify pharmacy. Sending Rx for Lipitor 20 mg daily. Pt verbalized understanding.

## 2020-07-16 DIAGNOSIS — F411 Generalized anxiety disorder: Secondary | ICD-10-CM | POA: Diagnosis not present

## 2020-07-30 DIAGNOSIS — F411 Generalized anxiety disorder: Secondary | ICD-10-CM | POA: Diagnosis not present

## 2020-08-02 ENCOUNTER — Encounter: Payer: Self-pay | Admitting: Physician Assistant

## 2020-08-02 ENCOUNTER — Telehealth (INDEPENDENT_AMBULATORY_CARE_PROVIDER_SITE_OTHER): Payer: Medicare HMO | Admitting: Physician Assistant

## 2020-08-02 ENCOUNTER — Other Ambulatory Visit: Payer: Self-pay | Admitting: Physician Assistant

## 2020-08-02 DIAGNOSIS — F332 Major depressive disorder, recurrent severe without psychotic features: Secondary | ICD-10-CM | POA: Diagnosis not present

## 2020-08-02 DIAGNOSIS — F419 Anxiety disorder, unspecified: Secondary | ICD-10-CM

## 2020-08-02 DIAGNOSIS — G4733 Obstructive sleep apnea (adult) (pediatric): Secondary | ICD-10-CM

## 2020-08-02 NOTE — Progress Notes (Signed)
Virtual Visit via Video   I connected with Sherri Smith on 08/02/20 at  9:30 AM EDT by a video enabled telemedicine application and verified that I am speaking with the correct person using two identifiers. Location patient: Home Location provider:  [facility], Office Persons participating in the virtual visit: Xin Klawitter, [name] PA-C  I discussed the limitations of evaluation and management by telemedicine and the availability of in person appointments. The patient expressed understanding and agreed to proceed.   Subjective:   HPI:   Anxiety and Depression Was seen by me on 06/28/20 for depression and anxiety. We started zoloft 25 mg daily at our last visit. She started this immediately after our last visit. Felt improvement after one week. Had improvement of suicidal thoughts. She is trying overall to take much better care of herself and feels more motivated to do so.   Has seen a counselor x 2 since last seen me.  ROS: See pertinent positives and negatives per HPI.  Patient Active Problem List   Diagnosis Date Noted  . Arthritis 06/28/2020  . Anxiety 06/28/2020  . Depression, recurrent ([place]) 06/28/2020  . OSA (obstructive sleep apnea) 06/28/2020  . Cataracts, bilateral 06/28/2020    Social History   Tobacco Use  . Smoking status: Never Smoker  . Smokeless tobacco: Never Used  Substance Use Topics  . Alcohol use: Yes    Alcohol/week: 4.0 standard drinks    Types: 4 Glasses of wine per week    Comment: one glass 5-6x/week    Current Outpatient Medications:  .  atorvastatin (LIPITOR) 20 MG tablet, Take 1 tablet (20 mg total) by mouth daily., Disp: 90 tablet, Rfl: 0 .  Multiple Vitamins-Minerals (MULTIVITAMIN WITH MINERALS) tablet, Take 1 tablet by mouth every evening., Disp: , Rfl:  .  QUERCETIN PO, Take by mouth daily., Disp: , Rfl:  .  sertraline (ZOLOFT) 25 MG tablet, Take 1 tablet (25 mg total) by mouth daily., Disp: 30 tablet, Rfl: 3 .   ZINC-VITAMIN C PO, Take by mouth daily., Disp: , Rfl:   No Known Allergies  Objective:   VITALS: Per patient if applicable, see vitals. GENERAL: Alert, appears well and in no acute distress. HEENT: Atraumatic, conjunctiva clear, no obvious abnormalities on inspection of external nose and ears. NECK: Normal movements of the head and neck. CARDIOPULMONARY: No increased WOB. Speaking in clear sentences. I:E ratio WNL.  MS: Moves all visible extremities without noticeable abnormality. PSYCH: Pleasant and cooperative, well-groomed. Speech normal rate and rhythm. Affect is appropriate. Insight and judgement are appropriate. Attention is focused, linear, and appropriate.  NEURO: CN grossly intact. Oriented as arrived to appointment on time with no prompting. Moves both UE equally.  SKIN: No obvious lesions, wounds, erythema, or cyanosis noted on face or hands.  Assessment and Plan:   Sherri Smith was seen today for anxiety.  Diagnoses and all orders for this visit:  Severe episode of recurrent major depressive disorder, without psychotic features ([place])  Anxiety   Improved. Continue Zoloft 25 mg daily. We are going to continue this dosage for now. She is continuing counseling. Follow-up in 3-4 months, sooner if concerns.  I discussed the assessment and treatment plan with the patient. The patient was provided an opportunity to ask questions and all were answered. The patient agreed with the plan and demonstrated an understanding of the instructions.   The patient was advised to call back or seek an in-person evaluation if the symptoms worsen or if the condition fails  to improve as anticipated.   [place], [place] 08/02/2020

## 2020-08-13 DIAGNOSIS — F411 Generalized anxiety disorder: Secondary | ICD-10-CM | POA: Diagnosis not present

## 2020-08-30 DIAGNOSIS — G4733 Obstructive sleep apnea (adult) (pediatric): Secondary | ICD-10-CM | POA: Diagnosis not present

## 2020-09-02 DIAGNOSIS — F411 Generalized anxiety disorder: Secondary | ICD-10-CM | POA: Diagnosis not present

## 2020-09-26 ENCOUNTER — Other Ambulatory Visit: Payer: Self-pay | Admitting: Physician Assistant

## 2020-10-07 ENCOUNTER — Telehealth: Payer: Self-pay | Admitting: Physician Assistant

## 2020-10-07 NOTE — Telephone Encounter (Signed)
Copied from [place] [id number]. Topic: Medicare AWV >> Oct 07, 2020 10:45 AM Harris-Coley, Hannah Beat wrote: Reason for CRM: Left message for patient to schedule Annual Wellness Visit.  Please schedule with Nurse Health Advisor Charlott Rakes, RN at [facility].

## 2020-10-07 NOTE — Telephone Encounter (Signed)
Copied from [place] [id number]. Topic: Medicare AWV >> Oct 07, 2020 10:45 AM Harris-Coley, Hannah Beat wrote: Reason for CRM: Left message for patient to schedule Annual Wellness Visit.  Please schedule with Nurse Health Advisor Charlott Rakes, RN at [facility].

## 2020-10-08 ENCOUNTER — Institutional Professional Consult (permissible substitution): Payer: Medicare HMO | Admitting: Neurology

## 2020-10-18 ENCOUNTER — Ambulatory Visit (INDEPENDENT_AMBULATORY_CARE_PROVIDER_SITE_OTHER): Payer: Medicare HMO

## 2020-10-18 DIAGNOSIS — Z789 Other specified health status: Secondary | ICD-10-CM

## 2020-10-18 DIAGNOSIS — Z0000 Encounter for general adult medical examination without abnormal findings: Secondary | ICD-10-CM

## 2020-10-18 NOTE — Progress Notes (Addendum)
Virtual Visit via Telephone Note  I connected with  Sherri Smith on 10/18/20 at 11:00 AM EDT by telephone and verified that I am speaking with the correct person using two identifiers.  Medicare Annual Wellness visit completed telephonically due to Covid-19 pandemic.   Persons participating in this call: This Health Coach and this patient.   Location: Patient: Home Provider: Office   I discussed the limitations, risks, security and privacy concerns of performing an evaluation and management service by telephone and the availability of in person appointments. The patient expressed understanding and agreed to proceed.  Unable to perform video visit due to video visit attempted and failed and/or patient does not have video capability.   Some vital signs may be absent or patient reported.   Willette Brace, LPN    Subjective:   Sherri Smith is a 75 y.o. female who presents for an Initial Medicare Annual Wellness Visit.  Review of Systems     Cardiac Risk Factors include: advanced age (>44mn, >>12women);dyslipidemia;obesity (BMI >30kg/m2)     Objective:    Today's Vitals   10/18/20 1045  PainSc: 5    There is no height or weight on file to calculate BMI.  Advanced Directives 10/18/2020 11/13/2016 02/05/2014  Does Patient Have a Medical Advance Directive? Yes No No  Type of Advance Directive Living will - -  Would patient like information on creating a medical advance directive? - - No - patient declined information    Current Medications (verified) Outpatient Encounter Medications as of 10/18/2020  Medication Sig   atorvastatin (LIPITOR) 20 MG tablet TAKE 1 TABLET(20 MG) BY MOUTH DAILY   Multiple Vitamins-Minerals (MULTIVITAMIN WITH MINERALS) tablet Take 1 tablet by mouth every evening.   QUERCETIN PO Take by mouth daily.   sertraline (ZOLOFT) 25 MG tablet Take 1 tablet (25 mg total) by mouth daily.   VITAMIN D PO Take 125 mcg by mouth. K 2 included   ZINC-VITAMIN C  PO Take by mouth daily.   No facility-administered encounter medications on file as of 10/18/2020.    Allergies (verified) Patient has no known allergies.   History: Past Medical History:  Diagnosis Date   Hyperlipidemia    Past Surgical History:  Procedure Laterality Date   APPENDECTOMY     OVARIAN CYST REMOVAL     1972   Family History  Problem Relation Age of Onset   Breast cancer Mother    Osteoarthritis Mother    Depression Mother    Heart disease Mother    Prostate cancer Father    Osteoarthritis Father    Hyperlipidemia Father    Hypertension Father    Heart disease Maternal Grandmother    Heart disease Maternal Grandfather    Social History   Socioeconomic History   Marital status: Married    Spouse name: Not on file   Number of children: Not on file   Years of education: Not on file   Highest education level: Not on file  Occupational History   Not on file  Tobacco Use   Smoking status: Never   Smokeless tobacco: Never  Vaping Use   Vaping Use: Never used  Substance and Sexual Activity   Alcohol use: Yes    Alcohol/week: 4.0 standard drinks    Types: 4 Glasses of wine per week    Comment: one glass 5-6x/week   Drug use: Never   Sexual activity: Not on file  Other Topics Concern   Not on file  Social  History Narrative   From the [place]   Retired    Moved to [place] to be closer to her daughter   Social Determinants of Radio broadcast assistant Strain: Low Risk    Difficulty of Paying Living Expenses: Not hard at all  Food Insecurity: No Food Insecurity   Worried About Charity fundraiser in the Last Year: Never true   Arboriculturist in the Last Year: Never true  Transportation Needs: No Transportation Needs   Lack of Transportation (Medical): No   Lack of Transportation (Non-Medical): No  Physical Activity: Insufficiently Active   Days of Exercise per Week: 3 days   Minutes of Exercise per Session: 20 min  Stress: No Stress Concern  Present   Feeling of Stress : Only a little  Social Connections: Moderately Integrated   Frequency of Communication with Friends and Family: More than three times a week   Frequency of Social Gatherings with Friends and Family: Three times a week   Attends Religious Services: 1 to 4 times per year   Active Member of Clubs or Organizations: No   Attends Archivist Meetings: Never   Marital Status: Married    Tobacco Counseling Counseling given: Not Answered   Clinical Intake:  Pre-visit preparation completed: Yes  Pain : 0-10 Pain Score: 5  Pain Type: Chronic pain Pain Location: Hand Pain Orientation: Left, Right Pain Descriptors / Indicators: Aching, Tightness, Numbness Pain Onset: More than a month ago Pain Frequency: Constant     BMI - recorded: 40.37 Nutritional Status: BMI > 30  Obese Nutritional Risks: None Diabetes: No  How often do you need to have someone help you when you read instructions, pamphlets, or other written materials from your doctor or pharmacy?: 1 - Never  Diabetic?No  Interpreter Needed?: No  Information entered by :: Lorenza Cambridge, LPN   Activities of Daily Living In your present state of health, do you have any difficulty performing the following activities: 10/18/2020 06/28/2020  Hearing? N N  Vision? N N  Difficulty concentrating or making decisions? Tempie Donning  Comment concentrating and memory at times concentrating  Walking or climbing stairs? N N  Comment - short of breath  Dressing or bathing? N N  Doing errands, shopping? N N  Preparing Food and eating ? N -  Using the Toilet? N -  In the past six months, have you accidently leaked urine? Y -  Comment wears a pad -  Do you have problems with loss of bowel control? N -  Managing your Medications? N -  Managing your Finances? N -  Housekeeping or managing your Housekeeping? N -  Some recent data might be hidden    Patient Care Team: [name], [place] as PCP - General  (Physician Assistant)  Indicate any recent Medical Services you may have received from other than [facility] providers in the past year (date may be approximate).     Assessment:   This is a routine wellness examination for Sherri Smith.  Hearing/Vision screen Hearing Screening - Comments:: Pt denies any hearing issues  Vision Screening - Comments:: Pt follows up with Dr Dr Baldo [facility] at [facility] for annual eye exams   Dietary issues and exercise activities discussed: Current Exercise Habits: Home exercise routine, Type of exercise: Other - see comments (stationary bike), Time (Minutes): 30, Frequency (Times/Week): 3, Weekly Exercise (Minutes/Week): 90   Goals Addressed  This Visit's Progress    Patient Stated       Lose weight        Depression Screen PHQ 2/9 Scores 10/18/2020 06/28/2020  PHQ - 2 Score 1 4  PHQ- 9 Score - 20    Fall Risk Fall Risk  10/18/2020 06/28/2020  Falls in the past year? 0 0  Number falls in past yr: 0 0  Injury with Fall? 0 0  Risk for fall due to : Impaired vision -  Follow up Falls prevention discussed -    FALL RISK PREVENTION PERTAINING TO THE HOME:  Any stairs in or around the home? No  If so, are there any without handrails? No  Home free of loose throw rugs in walkways, pet beds, electrical cords, etc? Yes  Adequate lighting in your home to reduce risk of falls? Yes   ASSISTIVE DEVICES UTILIZED TO PREVENT FALLS:  Life alert? No  Use of a cane, walker or w/c? No  Grab bars in the bathroom? Yes  Shower chair or bench in shower? No  Elevated toilet seat or a handicapped toilet? No   TIMED UP AND GO:  Was the test performed? No     Cognitive Function:     6CIT Screen 10/18/2020  What Year? 0 points  What month? 0 points  What time? 0 points  Count back from 20 0 points  Months in reverse 2 points  Repeat phrase 0 points  Total Score 2    Immunizations Immunization History  Administered Date(s) Administered    PFIZER(Purple Top)SARS-COV-2 Vaccination 05/29/2019, 06/27/2019, 01/04/2020    TDAP status: Due, Education has been provided regarding the importance of this vaccine. Advised may receive this vaccine at local pharmacy or Health Dept. Aware to provide a copy of the vaccination record if obtained from local pharmacy or Health Dept. Verbalized acceptance and understanding.  Flu Vaccine status: Due, Education has been provided regarding the importance of this vaccine. Advised may receive this vaccine at local pharmacy or Health Dept. Aware to provide a copy of the vaccination record if obtained from local pharmacy or Health Dept. Verbalized acceptance and understanding.  Pneumococcal vaccine status: Due, Education has been provided regarding the importance of this vaccine. Advised may receive this vaccine at local pharmacy or Health Dept. Aware to provide a copy of the vaccination record if obtained from local pharmacy or Health Dept. Verbalized acceptance and understanding.  Covid-19 vaccine status: Completed vaccines  Qualifies for Shingles Vaccine? Yes   Zostavax completed No   Shingrix Completed?: No.    Education has been provided regarding the importance of this vaccine. Patient has been advised to call insurance company to determine out of pocket expense if they have not yet received this vaccine. Advised may also receive vaccine at local pharmacy or Health Dept. Verbalized acceptance and understanding.  Screening Tests Health Maintenance  Topic Date Due   Hepatitis C Screening  Never done   TETANUS/TDAP  Never done   Zoster Vaccines- Shingrix (1 of 2) Never done   PNA vac Low Risk Adult (1 of 2 - PCV13) Never done   COVID-19 Vaccine (4 - Booster for Pfizer series) 05/06/2020   MAMMOGRAM  10/18/2021 (Originally 03/24/1997)   DEXA SCAN  10/18/2021 (Originally 03/24/2012)   COLONOSCOPY (Pts 45-66yr Insurance coverage will need to be confirmed)  10/18/2021 (Originally 03/24/1992)    INFLUENZA VACCINE  10/28/2020   HPV VACCINES  Aged Out    Health Maintenance  Health Maintenance Due  Topic Date Due   Hepatitis C Screening  Never done   TETANUS/TDAP  Never done   Zoster Vaccines- Shingrix (1 of 2) Never done   PNA vac Low Risk Adult (1 of 2 - PCV13) Never done   COVID-19 Vaccine (4 - Booster for Pfizer series) 05/06/2020    Colorectal  cancer : declined   Mammogram status: No longer required due to per patient .     Additional Screening:  Hepatitis C Screening: does qualify;   Vision Screening: Recommended annual ophthalmology exams for early detection of glaucoma and other disorders of the eye. Is the patient up to date with their annual eye exam?  Yes  Who is the provider or what is the name of the office in which the patient attends annual eye exams? Dr Baldo Ash  If pt is not established with a provider, would they like to be referred to a provider to establish care? No .   Dental Screening: Recommended annual dental exams for proper oral hygiene  Community Resource Referral / Chronic Care Management: CRR required this visit?  Yes   CCM required this visit?  No      Plan:     I have personally reviewed and noted the following in the patient's chart:   Medical and social history Use of alcohol, tobacco or illicit drugs  Current medications and supplements including opioid prescriptions. Patient is not currently taking opioid prescriptions. Functional ability and status Nutritional status Physical activity Advanced directives List of other physicians Hospitalizations, surgeries, and ER visits in previous 12 months Vitals Screenings to include cognitive, depression, and falls Referrals and appointments  In addition, I have reviewed and discussed with patient certain preventive protocols, quality metrics, and best practice recommendations. A written personalized care plan for preventive services as well as general preventive health  recommendations were provided to patient.     Willette Brace, LPN   [id number]   Nurse Notes: Pt is requesting refill called into [facility] on [address] [phone] for Sertraline (Zoloft) 25 mg one by mouth daily. Please advise

## 2020-10-18 NOTE — Patient Instructions (Addendum)
[name] , Thank you for taking time to come for your Medicare Wellness Visit. I appreciate your ongoing commitment to your health goals. Please review the following plan we discussed and let me know if I can assist you in the future.   Screening recommendations/referrals: Colonoscopy: Declined  Mammogram: Declined  Bone Density: Declined and discussed will readdress later  Recommended yearly ophthalmology/optometry visit for glaucoma screening and checkup Recommended yearly dental visit for hygiene and checkup  Vaccinations: Influenza vaccine: Due after 10/28/20 Pneumococcal vaccine: pt will update  Tdap vaccine: Declined  Shingles vaccine: Shingrix discussed. Please contact your pharmacy for coverage information.    Covid-19:Completed 3/1, 3/30, & 01/04/20  Advanced directives: Please bring a copy of your health care power of attorney and living will to the office at your convenience.  Conditions/risks identified: Lose weight   Next appointment: Follow up in one year for your annual wellness visit    Preventive Care 65 Years and Older, Female Preventive care refers to lifestyle choices and visits with your health care provider that can promote health and wellness. What does preventive care include? A yearly physical exam. This is also called an annual well check. Dental exams once or twice a year. Routine eye exams. Ask your health care provider how often you should have your eyes checked. Personal lifestyle choices, including: Daily care of your teeth and gums. Regular physical activity. Eating a healthy diet. Avoiding tobacco and drug use. Limiting alcohol use. Practicing safe sex. Taking low-dose aspirin every day. Taking vitamin and mineral supplements as recommended by your health care provider. What happens during an annual well check? The services and screenings done by your health care provider during your annual well check will depend on your age, overall health,  lifestyle risk factors, and family history of disease. Counseling  Your health care provider may ask you questions about your: Alcohol use. Tobacco use. Drug use. Emotional well-being. Home and relationship well-being. Sexual activity. Eating habits. History of falls. Memory and ability to understand (cognition). Work and work Statistician. Reproductive health. Screening  You may have the following tests or measurements: Height, weight, and BMI. Blood pressure. Lipid and cholesterol levels. These may be checked every 5 years, or more frequently if you are over 26 years old. Skin check. Lung cancer screening. You may have this screening every year starting at age 68 if you have a 30-pack-year history of smoking and currently smoke or have quit within the past 15 years. Fecal occult blood test (FOBT) of the stool. You may have this test every year starting at age 9. Flexible sigmoidoscopy or colonoscopy. You may have a sigmoidoscopy every 5 years or a colonoscopy every 10 years starting at age 37. Hepatitis C blood test. Hepatitis B blood test. Sexually transmitted disease (STD) testing. Diabetes screening. This is done by checking your blood sugar (glucose) after you have not eaten for a while (fasting). You may have this done every 1-3 years. Bone density scan. This is done to screen for osteoporosis. You may have this done starting at age 50. Mammogram. This may be done every 1-2 years. Talk to your health care provider about how often you should have regular mammograms. Talk with your health care provider about your test results, treatment options, and if necessary, the need for more tests. Vaccines  Your health care provider may recommend certain vaccines, such as: Influenza vaccine. This is recommended every year. Tetanus, diphtheria, and acellular pertussis (Tdap, Td) vaccine. You may need a Td  booster every 10 years. Zoster vaccine. You may need this after age  89. Pneumococcal 13-valent conjugate (PCV13) vaccine. One dose is recommended after age 75. Pneumococcal polysaccharide (PPSV23) vaccine. One dose is recommended after age 88. Talk to your health care provider about which screenings and vaccines you need and how often you need them. This information is not intended to replace advice given to you by your health care provider. Make sure you discuss any questions you have with your health care provider. Document Released: 04/12/2015 Document Revised: 12/04/2015 Document Reviewed: 01/15/2015 Elsevier Interactive Patient Education  2017 [place] Prevention in the Home Falls can cause injuries. They can happen to people of all ages. There are many things you can do to make your home safe and to help prevent falls. What can I do on the outside of my home? Regularly fix the edges of walkways and driveways and fix any cracks. Remove anything that might make you trip as you walk through a door, such as a raised step or threshold. Trim any bushes or trees on the path to your home. Use bright outdoor lighting. Clear any walking paths of anything that might make someone trip, such as rocks or tools. Regularly check to see if handrails are loose or broken. Make sure that both sides of any steps have handrails. Any raised decks and porches should have guardrails on the edges. Have any leaves, snow, or ice cleared regularly. Use sand or salt on walking paths during winter. Clean up any spills in your garage right away. This includes oil or grease spills. What can I do in the bathroom? Use night lights. Install grab bars by the toilet and in the tub and shower. Do not use towel bars as grab bars. Use non-skid mats or decals in the tub or shower. If you need to sit down in the shower, use a plastic, non-slip stool. Keep the floor dry. Clean up any water that spills on the floor as soon as it happens. Remove soap buildup in the tub or shower  regularly. Attach bath mats securely with double-sided non-slip rug tape. Do not have throw rugs and other things on the floor that can make you trip. What can I do in the bedroom? Use night lights. Make sure that you have a light by your bed that is easy to reach. Do not use any sheets or blankets that are too big for your bed. They should not hang down onto the floor. Have a firm chair that has side arms. You can use this for support while you get dressed. Do not have throw rugs and other things on the floor that can make you trip. What can I do in the kitchen? Clean up any spills right away. Avoid walking on wet floors. Keep items that you use a lot in easy-to-reach places. If you need to reach something above you, use a strong step stool that has a grab bar. Keep electrical cords out of the way. Do not use floor polish or wax that makes floors slippery. If you must use wax, use non-skid floor wax. Do not have throw rugs and other things on the floor that can make you trip. What can I do with my stairs? Do not leave any items on the stairs. Make sure that there are handrails on both sides of the stairs and use them. Fix handrails that are broken or loose. Make sure that handrails are as long as the stairways. Check any carpeting  to make sure that it is firmly attached to the stairs. Fix any carpet that is loose or worn. Avoid having throw rugs at the top or bottom of the stairs. If you do have throw rugs, attach them to the floor with carpet tape. Make sure that you have a light switch at the top of the stairs and the bottom of the stairs. If you do not have them, ask someone to add them for you. What else can I do to help prevent falls? Wear shoes that: Do not have high heels. Have rubber bottoms. Are comfortable and fit you well. Are closed at the toe. Do not wear sandals. If you use a stepladder: Make sure that it is fully opened. Do not climb a closed stepladder. Make sure that  both sides of the stepladder are locked into place. Ask someone to hold it for you, if possible. Clearly mark and make sure that you can see: Any grab bars or handrails. First and last steps. Where the edge of each step is. Use tools that help you move around (mobility aids) if they are needed. These include: Canes. Walkers. Scooters. Crutches. Turn on the lights when you go into a dark area. Replace any light bulbs as soon as they burn out. Set up your furniture so you have a clear path. Avoid moving your furniture around. If any of your floors are uneven, fix them. If there are any pets around you, be aware of where they are. Review your medicines with your doctor. Some medicines can make you feel dizzy. This can increase your chance of falling. Ask your doctor what other things that you can do to help prevent falls. This information is not intended to replace advice given to you by your health care provider. Make sure you discuss any questions you have with your health care provider. Document Released: 01/10/2009 Document Revised: 08/22/2015 Document Reviewed: 04/20/2014 Elsevier Interactive Patient Education  2017 [employer].

## 2020-10-20 ENCOUNTER — Other Ambulatory Visit: Payer: Self-pay | Admitting: Physician Assistant

## 2020-10-21 DIAGNOSIS — F411 Generalized anxiety disorder: Secondary | ICD-10-CM | POA: Diagnosis not present

## 2020-10-28 ENCOUNTER — Telehealth: Payer: Self-pay

## 2020-10-28 NOTE — Chronic Care Management (AMB) (Signed)
  Chronic Care Management   Note  10/28/2020 Name: Sherri Smith MRN: [id number] DOB: [birth year]  [name] is a 75 y.o. year old female who is a primary care patient of [name], [place]. I reached out to Jamey Ripa by phone today in response to a referral sent by Ms. Lenice Llamas PCP, [name], [place]      [name] was given information about Chronic Care Management services today including:  CCM service includes personalized support from designated clinical staff supervised by her physician, including individualized plan of care and coordination with other care providers 24/7 contact phone numbers for assistance for urgent and routine care needs. Service will only be billed when office clinical staff spend 20 minutes or more in a month to coordinate care. Only one practitioner may furnish and bill the service in a calendar month. The patient may stop CCM services at any time (effective at the end of the month) by phone call to the office staff. The patient will be responsible for cost sharing (co-pay) of up to 20% of the service fee (after annual deductible is met).  Patient did not agree to enrollment in care management services and does not wish to consider at this time.  Follow up plan: Patient declines  engagement by the care management team at this time and states that she will call when she figures out her schedule. Appropriate care team members and provider have been notified via electronic communication.   Noreene Larsson, [place], [place], [place] [address] Direct Dial: [phone] [email or web address] Website: [email or web address]

## 2020-12-22 ENCOUNTER — Other Ambulatory Visit: Payer: Self-pay | Admitting: Family Medicine

## 2021-01-03 DIAGNOSIS — H5231 Anisometropia: Secondary | ICD-10-CM | POA: Diagnosis not present

## 2021-01-03 DIAGNOSIS — H25013 Cortical age-related cataract, bilateral: Secondary | ICD-10-CM | POA: Diagnosis not present

## 2021-01-03 DIAGNOSIS — H524 Presbyopia: Secondary | ICD-10-CM | POA: Diagnosis not present

## 2021-01-03 DIAGNOSIS — H5712 Ocular pain, left eye: Secondary | ICD-10-CM | POA: Diagnosis not present

## 2021-01-03 DIAGNOSIS — H04123 Dry eye syndrome of bilateral lacrimal glands: Secondary | ICD-10-CM | POA: Diagnosis not present

## 2021-01-03 DIAGNOSIS — H52223 Regular astigmatism, bilateral: Secondary | ICD-10-CM | POA: Diagnosis not present

## 2021-01-07 ENCOUNTER — Other Ambulatory Visit: Payer: Self-pay | Admitting: Family Medicine

## 2021-01-20 DIAGNOSIS — Z20828 Contact with and (suspected) exposure to other viral communicable diseases: Secondary | ICD-10-CM | POA: Diagnosis not present

## 2021-01-20 DIAGNOSIS — Z20822 Contact with and (suspected) exposure to covid-19: Secondary | ICD-10-CM | POA: Diagnosis not present

## 2021-02-11 ENCOUNTER — Telehealth: Payer: Self-pay

## 2021-02-11 MED ORDER — SERTRALINE HCL 25 MG PO TABS: ORAL_TABLET | ORAL | 2 refills | Status: DC

## 2021-02-11 NOTE — Telephone Encounter (Signed)
Pt notified Rx for Zoloft was sent to pharmacy.

## 2021-02-11 NOTE — Telephone Encounter (Signed)
LAST APPOINTMENT DATE:  08/02/20  NEXT APPOINTMENT DATE: None  MEDICATION:sertraline (ZOLOFT) 25 MG tablet  PHARMACY: [facility] [place], [place] AT [address]

## 2021-03-24 ENCOUNTER — Other Ambulatory Visit: Payer: Self-pay | Admitting: Physician Assistant

## 2021-04-22 ENCOUNTER — Institutional Professional Consult (permissible substitution): Payer: Medicare HMO | Admitting: Neurology

## 2021-04-26 DIAGNOSIS — J209 Acute bronchitis, unspecified: Secondary | ICD-10-CM | POA: Diagnosis not present

## 2021-05-01 DIAGNOSIS — Z20828 Contact with and (suspected) exposure to other viral communicable diseases: Secondary | ICD-10-CM | POA: Diagnosis not present

## 2021-05-10 ENCOUNTER — Other Ambulatory Visit: Payer: Self-pay | Admitting: Physician Assistant

## 2021-06-17 ENCOUNTER — Encounter: Payer: Self-pay | Admitting: Physician Assistant

## 2021-06-17 ENCOUNTER — Ambulatory Visit (INDEPENDENT_AMBULATORY_CARE_PROVIDER_SITE_OTHER): Payer: Medicare HMO | Admitting: Physician Assistant

## 2021-06-17 VITALS — BP 124/80 | HR 63 | Temp 98.0°F | Ht 64.0 in | Wt 232.0 lb

## 2021-06-17 DIAGNOSIS — F419 Anxiety disorder, unspecified: Secondary | ICD-10-CM | POA: Diagnosis not present

## 2021-06-17 DIAGNOSIS — M79641 Pain in right hand: Secondary | ICD-10-CM

## 2021-06-17 DIAGNOSIS — F332 Major depressive disorder, recurrent severe without psychotic features: Secondary | ICD-10-CM

## 2021-06-17 MED ORDER — SERTRALINE HCL 50 MG PO TABS: 50.0000 mg | ORAL_TABLET | Freq: Every day | ORAL | 1 refills | Status: DC

## 2021-06-17 NOTE — Patient Instructions (Addendum)
It was great to see you! ? ?Increase zoloft to 50 mg ?Follow-up in 4-6 weeks to do a physical and check in! ? ?Consider counseling with Larene [facility] (see business card) ?Keep trying to make attempts at making new friends and doing new experiences ? ?https://www.[place].gov/departments/parks-recreation/active-adults-50/clubs-and-groups ? ?[place] ?The 50+ [employer] each Wednesday morning from September to June at locations throughout [place] and surrounding areas. New and returning members must qualify for membership each September by walking around the pond at [facility] twice (3.2 miles) in under 70 minutes. An annual membership fee of $10 is due at qualifying. For more information, call [phone]. ? ?"Booked for [employer] ?This book club meets monthly from September to June on the second Friday at 12 noon at the [facility]. The club is free and open to anyone 50 years and better. ?2023 Reading List: ?January 14 - "Grandma [place]'s Walk" by Wanita Chamberlain ?February 11 - "[address]" by Kym Groom ?March 10 - "[employer]" by Jearl Klinefelter ?April 14 - "Trust No One" by Pearlie Oyster ?May 12 - "[employer]" by [employer] ?June 9 - "The Peach Keeper" by Lambert Keto ? ?Trial wrist exercise ?Voltaren Gel is available over the counter. ?  ?You are to apply this gel to your injured body part twice daily (morning and evening). ?  ?Some people are sensitive to the medication and can develop a sunburn-like rash.  If you have only mild symptoms it is okay to continue to use the medication but if you have any breakdown of your skin you should discontinue its use and please let us know. ?  ? ? ?Consider PT for your wrist ? ?Take care, ? ?[name] PA-C  ?

## 2021-06-17 NOTE — Progress Notes (Signed)
Sherri Smith is a 76 y.o. female here for a follow up of a pre-existing problem. ? ?History of Present Illness:  ? ?Chief Complaint  ?Patient presents with  ? Anxiety  ?  Pt stated that she went thru a ruff patch a few weeks ago and she know why. She does not feel like dying anymore. Also having some tingling, numbness, and aches/pain in Rt hand. It has gotten worse over the past 6 mos and it is very intense.  ? Depression  ? ?Anxiety/Depression ?Sherri Smith is currently compliant with taking zoloft 25 mg daily with no adverse effects. Although she had a rough patch which she believes was situational due to family stress, she has been managing well. Overall she has felt a new desire to leave the house often and live again. Currently she is participating in a water color class and has tried to increase her walking in parks or beautiful places as she likes to call it.  Something big that she has moved towards was returning to church which holds a lot of triggers for her. Sherri Smith has tried participating in counseling earlier last year, but due to her counselor not remembering who she was after 4 sessions she decided to not return. At this time she is tolerating well. Denies SI/HI.  ? ?Right Hand Paresthesia ?In addition to other concerns, pt has been experiencing tingling, numbness, and aches/pain in her right hand for the past 6 months, worsening as time went on. States that she has had times where she found it difficult to hold her toothbrush or strap on her bra due to the pain. 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 In an effort to manage her pain, she has tried doing stretches and taking ibuprofen but this hasn't provided her with prolonged relief. At this time she expresses desire to try chiropractic medicine due to possibility that it could help since she received so much relief with this option for her past back pain.  ? ?Past Medical  History:  ?Diagnosis Date  ? Hyperlipidemia   ? ?  ?Social History  ? ?Tobacco Use  ? Smoking status: Never  ? Smokeless tobacco: Never  ?Vaping Use  ? Vaping Use: Never used  ?Substance Use Topics  ? Alcohol use: Yes  ?  Alcohol/week: 4.0 standard drinks  ?  Types: 4 Glasses of wine per week  ?  Comment: one glass 5-6x/week  ? Drug use: Never  ? ? ?Past Surgical History:  ?Procedure Laterality Date  ? APPENDECTOMY    ? OVARIAN CYST REMOVAL    ? 1972  ? ? ?Family History  ?Problem Relation Age of Onset  ? Breast cancer Mother   ? Osteoarthritis Mother   ? Depression Mother   ? Heart disease Mother   ? Prostate cancer Father   ? Osteoarthritis Father   ? Hyperlipidemia Father   ? Hypertension Father   ? Heart disease Maternal Grandmother   ? Heart disease Maternal Grandfather   ? ? ?No Known Allergies ? ?Current Medications:  ? ?Current Outpatient Medications:  ?  atorvastatin (LIPITOR) 20 MG tablet, TAKE 1 TABLET(20 MG) BY MOUTH DAILY, Disp: 90 tablet, Rfl: 0 ?  Multiple Vitamins-Minerals (MULTIVITAMIN WITH MINERALS) tablet, Take 1 tablet by mouth every evening., Disp: , Rfl:  ?  sertraline (ZOLOFT) 25 MG tablet, TAKE 1 TABLET(25 MG) BY MOUTH DAILY, Disp: 30 tablet, Rfl: 2 ?  VITAMIN D  PO, Take 125 mcg by mouth. K 2 included, Disp: , Rfl:  ?  QUERCETIN PO, Take by mouth daily., Disp: , Rfl:  ?  ZINC-VITAMIN C PO, Take by mouth daily., Disp: , Rfl:   ? ?Review of Systems:  ? ?Review of Systems  ?Psychiatric/Behavioral:  Positive for depression.   ?Negative unless otherwise specified per HPI. ?Vitals:  ? ?Vitals:  ? 06/17/21 1039  ?BP: 124/80  ?Pulse: 63  ?Temp: 98 ?F (36.7 ?C)  ?SpO2: 96%  ?Weight: 232 lb (105.2 kg)  ?Height: '5\' 4"'$  (1.626 m)  ?   ?Body mass index is 39.82 kg/m?. ? ?Physical Exam:  ? ?Physical Exam ?Vitals and nursing note reviewed.  ?Constitutional:   ?   General: She is not in acute distress. ?   Appearance: She is well-developed. She is not ill-appearing or toxic-appearing.  ?Cardiovascular:  ?    Rate and Rhythm: Normal rate and regular rhythm.  ?   Pulses: Normal pulses.  ?   Heart sounds: Normal heart sounds, S1 normal and S2 normal.  ?Pulmonary:  ?   Effort: Pulmonary effort is normal.  ?   Breath sounds: Normal breath sounds.  ?Musculoskeletal:  ?   Comments: Normal ROM of r hand  ?Skin: ?   General: Skin is warm and dry.  ?Neurological:  ?   Mental Status: She is alert.  ?   GCS: GCS eye subscore is 4. GCS verbal subscore is 5. GCS motor subscore is 6.  ?   Comments: Grip strength 5/5 b/l  ?Psychiatric:     ?   Speech: Speech normal.     ?   Behavior: Behavior normal. Behavior is cooperative.  ? ? ?Assessment and Plan:  ? ?Anxiety;Severe episode of recurrent major depressive disorder, without psychotic features ([place]) ?Stable but I do feel as though her anxiety could be better controlled ?No red flags; Denies SI/HI ?Increase Zoloft to 50 mg daily  ?I advised patient that if they develop any SI, to tell someone immediately and seek medical attention ?Recommendation to [facility] out to [facility] for counseling ?Follow up in 3 months for a physical, sooner if concerns ? ?R hand pain ?Suspect OA or carpal tunnel ?Handout provided ?Recommend prn splinting, topical voltaren, exercises ?Consider PT if persists ? ?I,Havlyn C Ratchford,acting as a scribe for [employer], PA.,have documented all relevant documentation on the behalf of [name], PA,as directed by  [name], PA while in the presence of [name], [place]. ? ?IInda Coke, PA, have reviewed all documentation for this visit. The documentation on 06/17/21 for the exam, diagnosis, procedures, and orders are all accurate and complete. ? ? ?[name], PA-C ? ?

## 2021-06-19 DIAGNOSIS — M9901 Segmental and somatic dysfunction of cervical region: Secondary | ICD-10-CM | POA: Diagnosis not present

## 2021-06-19 DIAGNOSIS — M9903 Segmental and somatic dysfunction of lumbar region: Secondary | ICD-10-CM | POA: Diagnosis not present

## 2021-06-19 DIAGNOSIS — M9904 Segmental and somatic dysfunction of sacral region: Secondary | ICD-10-CM | POA: Diagnosis not present

## 2021-06-19 DIAGNOSIS — M9905 Segmental and somatic dysfunction of pelvic region: Secondary | ICD-10-CM | POA: Diagnosis not present

## 2021-06-23 ENCOUNTER — Other Ambulatory Visit: Payer: Self-pay | Admitting: Physician Assistant

## 2021-06-24 DIAGNOSIS — M9904 Segmental and somatic dysfunction of sacral region: Secondary | ICD-10-CM | POA: Diagnosis not present

## 2021-06-24 DIAGNOSIS — M9905 Segmental and somatic dysfunction of pelvic region: Secondary | ICD-10-CM | POA: Diagnosis not present

## 2021-06-24 DIAGNOSIS — M9903 Segmental and somatic dysfunction of lumbar region: Secondary | ICD-10-CM | POA: Diagnosis not present

## 2021-06-24 DIAGNOSIS — M9901 Segmental and somatic dysfunction of cervical region: Secondary | ICD-10-CM | POA: Diagnosis not present

## 2021-06-26 DIAGNOSIS — M9904 Segmental and somatic dysfunction of sacral region: Secondary | ICD-10-CM | POA: Diagnosis not present

## 2021-06-26 DIAGNOSIS — M9901 Segmental and somatic dysfunction of cervical region: Secondary | ICD-10-CM | POA: Diagnosis not present

## 2021-06-26 DIAGNOSIS — M9903 Segmental and somatic dysfunction of lumbar region: Secondary | ICD-10-CM | POA: Diagnosis not present

## 2021-06-26 DIAGNOSIS — M9905 Segmental and somatic dysfunction of pelvic region: Secondary | ICD-10-CM | POA: Diagnosis not present

## 2021-07-01 DIAGNOSIS — M9901 Segmental and somatic dysfunction of cervical region: Secondary | ICD-10-CM | POA: Diagnosis not present

## 2021-07-01 DIAGNOSIS — M9905 Segmental and somatic dysfunction of pelvic region: Secondary | ICD-10-CM | POA: Diagnosis not present

## 2021-07-01 DIAGNOSIS — M9903 Segmental and somatic dysfunction of lumbar region: Secondary | ICD-10-CM | POA: Diagnosis not present

## 2021-07-01 DIAGNOSIS — M9904 Segmental and somatic dysfunction of sacral region: Secondary | ICD-10-CM | POA: Diagnosis not present

## 2021-07-03 DIAGNOSIS — M9904 Segmental and somatic dysfunction of sacral region: Secondary | ICD-10-CM | POA: Diagnosis not present

## 2021-07-03 DIAGNOSIS — M9903 Segmental and somatic dysfunction of lumbar region: Secondary | ICD-10-CM | POA: Diagnosis not present

## 2021-07-03 DIAGNOSIS — M9901 Segmental and somatic dysfunction of cervical region: Secondary | ICD-10-CM | POA: Diagnosis not present

## 2021-07-03 DIAGNOSIS — M9905 Segmental and somatic dysfunction of pelvic region: Secondary | ICD-10-CM | POA: Diagnosis not present

## 2021-07-08 DIAGNOSIS — M9904 Segmental and somatic dysfunction of sacral region: Secondary | ICD-10-CM | POA: Diagnosis not present

## 2021-07-08 DIAGNOSIS — M9901 Segmental and somatic dysfunction of cervical region: Secondary | ICD-10-CM | POA: Diagnosis not present

## 2021-07-08 DIAGNOSIS — M9903 Segmental and somatic dysfunction of lumbar region: Secondary | ICD-10-CM | POA: Diagnosis not present

## 2021-07-08 DIAGNOSIS — M9905 Segmental and somatic dysfunction of pelvic region: Secondary | ICD-10-CM | POA: Diagnosis not present

## 2021-07-15 DIAGNOSIS — M9903 Segmental and somatic dysfunction of lumbar region: Secondary | ICD-10-CM | POA: Diagnosis not present

## 2021-07-15 DIAGNOSIS — M9905 Segmental and somatic dysfunction of pelvic region: Secondary | ICD-10-CM | POA: Diagnosis not present

## 2021-07-15 DIAGNOSIS — M9901 Segmental and somatic dysfunction of cervical region: Secondary | ICD-10-CM | POA: Diagnosis not present

## 2021-07-15 DIAGNOSIS — M9904 Segmental and somatic dysfunction of sacral region: Secondary | ICD-10-CM | POA: Diagnosis not present

## 2021-07-17 DIAGNOSIS — M9901 Segmental and somatic dysfunction of cervical region: Secondary | ICD-10-CM | POA: Diagnosis not present

## 2021-07-17 DIAGNOSIS — M9905 Segmental and somatic dysfunction of pelvic region: Secondary | ICD-10-CM | POA: Diagnosis not present

## 2021-07-17 DIAGNOSIS — M9904 Segmental and somatic dysfunction of sacral region: Secondary | ICD-10-CM | POA: Diagnosis not present

## 2021-07-17 DIAGNOSIS — M9903 Segmental and somatic dysfunction of lumbar region: Secondary | ICD-10-CM | POA: Diagnosis not present

## 2021-07-22 DIAGNOSIS — M9905 Segmental and somatic dysfunction of pelvic region: Secondary | ICD-10-CM | POA: Diagnosis not present

## 2021-07-22 DIAGNOSIS — M9901 Segmental and somatic dysfunction of cervical region: Secondary | ICD-10-CM | POA: Diagnosis not present

## 2021-07-22 DIAGNOSIS — M9904 Segmental and somatic dysfunction of sacral region: Secondary | ICD-10-CM | POA: Diagnosis not present

## 2021-07-22 DIAGNOSIS — M9903 Segmental and somatic dysfunction of lumbar region: Secondary | ICD-10-CM | POA: Diagnosis not present

## 2021-07-24 DIAGNOSIS — M9904 Segmental and somatic dysfunction of sacral region: Secondary | ICD-10-CM | POA: Diagnosis not present

## 2021-07-24 DIAGNOSIS — M9903 Segmental and somatic dysfunction of lumbar region: Secondary | ICD-10-CM | POA: Diagnosis not present

## 2021-07-24 DIAGNOSIS — M9901 Segmental and somatic dysfunction of cervical region: Secondary | ICD-10-CM | POA: Diagnosis not present

## 2021-07-24 DIAGNOSIS — M9905 Segmental and somatic dysfunction of pelvic region: Secondary | ICD-10-CM | POA: Diagnosis not present

## 2021-08-05 DIAGNOSIS — M9901 Segmental and somatic dysfunction of cervical region: Secondary | ICD-10-CM | POA: Diagnosis not present

## 2021-08-05 DIAGNOSIS — M9904 Segmental and somatic dysfunction of sacral region: Secondary | ICD-10-CM | POA: Diagnosis not present

## 2021-08-05 DIAGNOSIS — M9905 Segmental and somatic dysfunction of pelvic region: Secondary | ICD-10-CM | POA: Diagnosis not present

## 2021-08-05 DIAGNOSIS — M9903 Segmental and somatic dysfunction of lumbar region: Secondary | ICD-10-CM | POA: Diagnosis not present

## 2021-08-26 ENCOUNTER — Encounter: Payer: Self-pay | Admitting: Physician Assistant

## 2021-08-26 ENCOUNTER — Ambulatory Visit (INDEPENDENT_AMBULATORY_CARE_PROVIDER_SITE_OTHER): Payer: Medicare HMO | Admitting: Physician Assistant

## 2021-08-26 ENCOUNTER — Encounter: Payer: Self-pay | Admitting: Psychology

## 2021-08-26 VITALS — BP 120/78 | HR 55 | Temp 97.3°F | Ht 64.0 in | Wt 228.0 lb

## 2021-08-26 DIAGNOSIS — R519 Headache, unspecified: Secondary | ICD-10-CM | POA: Diagnosis not present

## 2021-08-26 DIAGNOSIS — H6123 Impacted cerumen, bilateral: Secondary | ICD-10-CM

## 2021-08-26 DIAGNOSIS — E782 Mixed hyperlipidemia: Secondary | ICD-10-CM

## 2021-08-26 DIAGNOSIS — R079 Chest pain, unspecified: Secondary | ICD-10-CM | POA: Diagnosis not present

## 2021-08-26 DIAGNOSIS — R413 Other amnesia: Secondary | ICD-10-CM

## 2021-08-26 DIAGNOSIS — R42 Dizziness and giddiness: Secondary | ICD-10-CM | POA: Diagnosis not present

## 2021-08-26 LAB — CBC WITH DIFFERENTIAL/PLATELET
Basophils Absolute: 0 10*3/uL (ref 0.0–0.1)
Basophils Relative: 0.6 % (ref 0.0–3.0)
Eosinophils Absolute: 0.1 10*3/uL (ref 0.0–0.7)
Eosinophils Relative: 1.6 % (ref 0.0–5.0)
HCT: 41.6 % (ref 36.0–46.0)
Hemoglobin: 14 g/dL (ref 12.0–15.0)
Lymphocytes Relative: 49 % — ABNORMAL HIGH (ref 12.0–46.0)
Lymphs Abs: 4.1 10*3/uL — ABNORMAL HIGH (ref 0.7–4.0)
MCHC: 33.7 g/dL (ref 30.0–36.0)
MCV: 88.5 fl (ref 78.0–100.0)
Monocytes Absolute: 0.5 10*3/uL (ref 0.1–1.0)
Monocytes Relative: 6 % (ref 3.0–12.0)
Neutro Abs: 3.6 10*3/uL (ref 1.4–7.7)
Neutrophils Relative %: 42.8 % — ABNORMAL LOW (ref 43.0–77.0)
Platelets: 256 10*3/uL (ref 150.0–400.0)
RBC: 4.7 Mil/uL (ref 3.87–5.11)
RDW: 14.6 % (ref 11.5–15.5)
WBC: 8.4 10*3/uL (ref 4.0–10.5)

## 2021-08-26 LAB — LIPID PANEL
Cholesterol: 139 mg/dL (ref 0–200)
HDL: 47.6 mg/dL (ref >39.00)
LDL Cholesterol: 60 mg/dL (ref 0–99)
NonHDL: 91.52
Total CHOL/HDL Ratio: 3
Triglycerides: 156 mg/dL — ABNORMAL HIGH (ref 0.0–149.0)
VLDL: 31.2 mg/dL (ref 0.0–40.0)

## 2021-08-26 LAB — URINALYSIS, ROUTINE W REFLEX MICROSCOPIC
Bilirubin Urine: NEGATIVE
Hgb urine dipstick: NEGATIVE
Ketones, ur: NEGATIVE
Nitrite: NEGATIVE
RBC / HPF: NONE SEEN (ref 0–?)
Specific Gravity, Urine: 1.01 (ref 1.000–1.030)
Total Protein, Urine: NEGATIVE
Urine Glucose: NEGATIVE
Urobilinogen, UA: 0.2 (ref 0.0–1.0)
pH: 6.5 (ref 5.0–8.0)

## 2021-08-26 LAB — COMPREHENSIVE METABOLIC PANEL
ALT: 23 U/L (ref 0–35)
AST: 20 U/L (ref 0–37)
Albumin: 4.7 g/dL (ref 3.5–5.2)
Alkaline Phosphatase: 53 U/L (ref 39–117)
BUN: 14 mg/dL (ref 6–23)
CO2: 27 mEq/L (ref 19–32)
Calcium: 9.4 mg/dL (ref 8.4–10.5)
Chloride: 105 mEq/L (ref 96–112)
Creatinine, Ser: 0.8 mg/dL (ref 0.40–1.20)
GFR: 72.58 mL/min (ref >60.00)
Glucose, Bld: 105 mg/dL — ABNORMAL HIGH (ref 70–99)
Potassium: 4 mEq/L (ref 3.5–5.1)
Sodium: 140 mEq/L (ref 135–145)
Total Bilirubin: 0.7 mg/dL (ref 0.2–1.2)
Total Protein: 7.2 g/dL (ref 6.0–8.3)

## 2021-08-26 LAB — TSH: TSH: 2.04 u[IU]/mL (ref 0.35–5.50)

## 2021-08-26 NOTE — Patient Instructions (Addendum)
It was great to see you!  EKG and orthostatic vitals are overall ok  Please work on trying to drink more fluids throughout the day  We are going to order imaging of your brain, if everything is normal, we will also order an ultrasound of your heart and a heart monitor to make sure nothing else is contributing to your symptoms  Take care,  [name] PA-C

## 2021-08-26 NOTE — Progress Notes (Signed)
Sherri Smith is a 76 y.o. female here for a new problem.  History of Present Illness:   Chief Complaint  Patient presents with   Dizziness    Pt c/o dizziness off and on x 2 months, has been more often lately, but after dizziness subsides has a headache on left side of head, goes away after 15 seconds. Denies blurred vision.    HPI  Dizziness  Patient complain of dizziness that has been onset for the past few months. States she has been experiencing dizziness more often. She reports after her dizziness resolved she usually get headaches. Located on left side of her head. She described this as "intermittent sharp"  pain. Usually last for 15-30 seconds. She has been feeling lightheadedness. Worse with certain motions. States she feel dizzy whenever she stands up after sitting position. Has had chest pain yesterday night which she described as "sharp" pain. She does not remember how long the pain lasted. She was able to got back to sleep. Some shortness of breath whenever she exercises, but not with ADLs. She has not tried any treatment for this issue. Denies chest pain at this time. No shortness of breath. Denies blurred vision. No associated photophobia, visual changes, numbness, or focal deficit.   Memory loss  Patient is concerned about her memory which has been going for a while. She notes her daughter had noticed this. She reports she has been forgetting to remember names. She has been forgetting where she placed her phone. Her mood seems to be better. She notes she is having hard time remembering things and does feel "foggy". She has been sleeping well at night. She would like this to be further evaluated. Denies any injury or trauma.   Difficulty hearing Concern for wax build-up. She sometimes uses hydrogen peroxide in her ears but has not recently. Has difficulty hearing in her right ear.  HLD Currently on atorvastatin 20 mg daily. Tolerating well -- needs updated lipids.  Past  Medical History:  Diagnosis Date   Hyperlipidemia      Social History   Tobacco Use   Smoking status: Never   Smokeless tobacco: Never  Vaping Use   Vaping Use: Never used  Substance Use Topics   Alcohol use: Yes    Alcohol/week: 4.0 standard drinks    Types: 4 Glasses of wine per week    Comment: one glass 5-6x/week   Drug use: Never    Past Surgical History:  Procedure Laterality Date   APPENDECTOMY     OVARIAN CYST REMOVAL     1972    Family History  Problem Relation Age of Onset   Breast cancer Mother    Osteoarthritis Mother    Depression Mother    Heart disease Mother    Prostate cancer Father    Osteoarthritis Father    Hyperlipidemia Father    Hypertension Father    Heart disease Maternal Grandmother    Heart disease Maternal Grandfather     No Known Allergies  Current Medications:   Current Outpatient Medications:    atorvastatin (LIPITOR) 20 MG tablet, TAKE 1 TABLET(20 MG) BY MOUTH DAILY, Disp: 90 tablet, Rfl: 0   Multiple Vitamins-Minerals (MULTIVITAMIN WITH MINERALS) tablet, Take 1 tablet by mouth every evening., Disp: , Rfl:    sertraline (ZOLOFT) 50 MG tablet, Take 1 tablet (50 mg total) by mouth daily., Disp: 90 tablet, Rfl: 1   VITAMIN D PO, Take 125 mcg by mouth. K 2 included, Disp: , Rfl:  Review of Systems:   ROS Negative unless otherwise specified per HPI.   Vitals:   Vitals:   08/26/21 0956  BP: 120/78  Pulse: (!) 55  Temp: (!) 97.3 F (36.3 C)  TempSrc: Temporal  SpO2: 98%  Weight: 228 lb (103.4 kg)  Height: '5\' 4"'$  (1.626 m)     Body mass index is 39.14 kg/m.  Physical Exam:   Physical Exam Vitals and nursing note reviewed.  Constitutional:      General: She is not in acute distress.    Appearance: She is well-developed. She is not ill-appearing or toxic-appearing.  HENT:     Right Ear: There is impacted cerumen. Tympanic membrane is not injected, scarred or perforated.     Left Ear: There is impacted cerumen.  Tympanic membrane is not injected or scarred.  Cardiovascular:     Rate and Rhythm: Normal rate and regular rhythm.     Pulses: Normal pulses.     Heart sounds: Normal heart sounds, S1 normal and S2 normal.  Pulmonary:     Effort: Pulmonary effort is normal.     Breath sounds: Normal breath sounds.  Skin:    General: Skin is warm and dry.  Neurological:     Mental Status: She is alert.     GCS: GCS eye subscore is 4. GCS verbal subscore is 5. GCS motor subscore is 6.  Psychiatric:        Speech: Speech normal.        Behavior: Behavior normal. Behavior is cooperative.   Ceruminosis is noted.  Wax is removed by syringing and manual debridement.   Assessment and Plan:   Positional lightheadedness; Chest pain, unspecified type; Nonintractable episodic headache, unspecified headache type EKG tracing is personally reviewed.  EKG notes NSR - bradycardia  No acute changes.  Orthostatics negative Due to concurrent HA with sx, will order MR brain and MRA of head and neck If testing negative or sx persist, will also order echo and/or heart monitor Follow-up based on symptoms and testing results  Memory change Due to patient's concern, will send for neurocognitive testing  Mixed hyperlipidemia Update lipid panel and adjust atorvastatin 20 mg accordingly.  Bilateral impacted cerumen Tolerated well No significant inflammation of b/l ear canals upon exam  I,Savera Zaman,acting as a scribe for [employer], PA.,have documented all relevant documentation on the behalf of [name], PA,as directed by  [name], PA while in the presence of [name], [place].   I, [name], [place], have reviewed all documentation for this visit. The documentation on 08/26/21 for the exam, diagnosis, procedures, and orders are all accurate and complete.  Time spent with patient today was 50 minutes which consisted of chart review, discussing diagnosis, work up of headache, chest pain,  lightheadedness, memory concerns and cerumen impaction, treatment answering questions and documentation.   [name], PA-C

## 2021-08-27 ENCOUNTER — Encounter (HOSPITAL_COMMUNITY): Payer: Self-pay

## 2021-08-27 ENCOUNTER — Ambulatory Visit (HOSPITAL_COMMUNITY)
Admission: RE | Admit: 2021-08-27 | Discharge: 2021-08-27 | Disposition: A | Discharge status: Home / Self Care | Payer: Medicare HMO | Source: Ambulatory Visit | Attending: Physician Assistant | Admitting: Physician Assistant

## 2021-08-27 DIAGNOSIS — R42 Dizziness and giddiness: Secondary | ICD-10-CM | POA: Insufficient documentation

## 2021-08-27 DIAGNOSIS — R519 Headache, unspecified: Secondary | ICD-10-CM

## 2021-08-27 DIAGNOSIS — R413 Other amnesia: Secondary | ICD-10-CM | POA: Insufficient documentation

## 2021-08-27 DIAGNOSIS — I6782 Cerebral ischemia: Secondary | ICD-10-CM | POA: Diagnosis not present

## 2021-08-27 IMAGING — MR MR HEAD W/O CM
10 series · 48 of 48 positions shown · non-contrast
Comparison: None Available.

CLINICAL DATA: Headache, new or worsening. Positional
lightheadedness



[Series 5: DWI · axial · 3.0mm · 1.36mm/px · z∈[+0,+149]mm · 9 of 104 slices shown (1 of 4)]
[im 1/104]
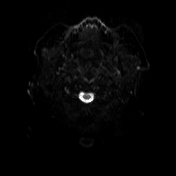
[im 13/104]
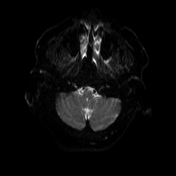
[im 26/104]
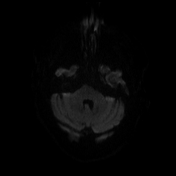
[im 39/104]
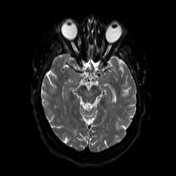
[im 52/104]
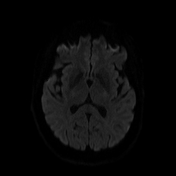
[im 65/104]
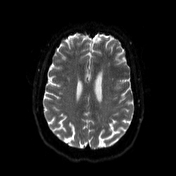
[im 78/104]
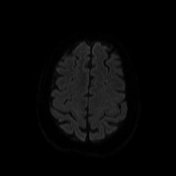
[im 91/104]
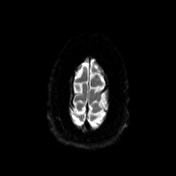
[im 104/104]
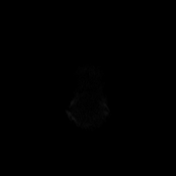

[Series 6: DWI · axial · 3.0mm · 1.36mm/px · z∈[+0,+149]mm · 4 of 52 slices shown (2 of 4)]
[im 1/52]
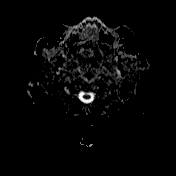
[im 18/52]
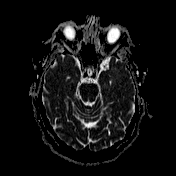
[im 35/52]
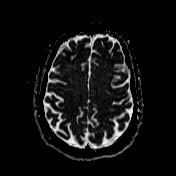
[im 52/52]
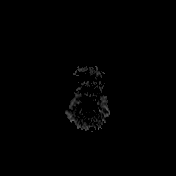

[Series 7: T1 · sagittal · 5.0mm · 0.75mm/px · 2 of 24 slices shown (1 of 2)]
[im 1/24]
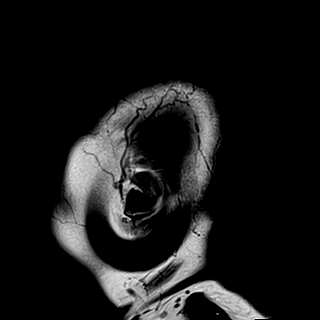
[im 24/24]
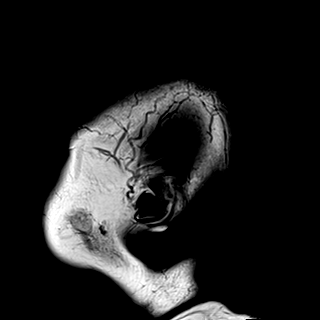

[Series 8: T2 · axial · 5.0mm · 0.62mm/px · z∈[+14,+150]mm · 2 of 23 slices shown (1 of 2)]
[im 1/23]
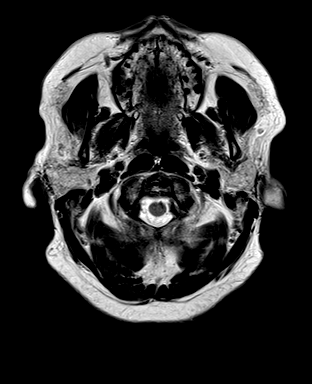
[im 23/23]
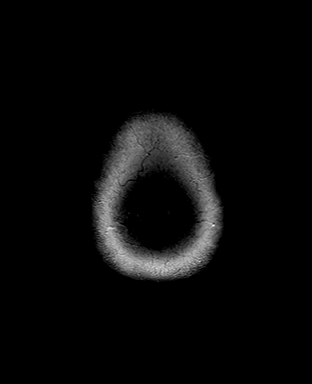

[Series 9: swi_images · axial · 3.0mm · 0.75mm/px · z∈[+3,+161]mm · 4 of 56 slices shown]
[im 1/56]
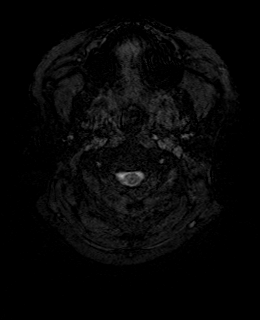
[im 19/56]
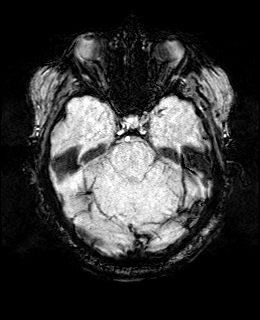
[im 37/56]
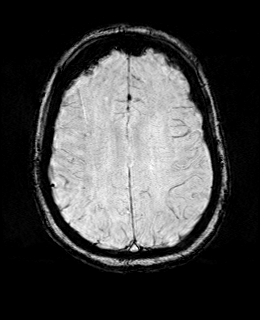
[im 56/56]
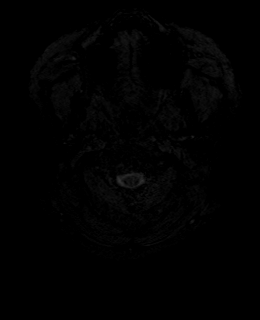

[Series 11: T1 · axial · 1.0mm · 0.94mm/px · z∈[-3,+152]mm · 13 of 160 slices shown (2 of 2)]
[im 1/160]
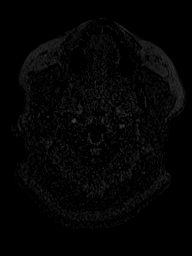
[im 14/160]
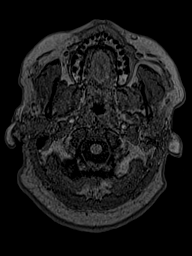
[im 27/160]
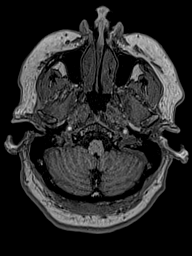
[im 40/160]
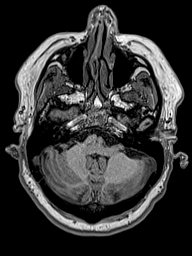
[im 54/160]
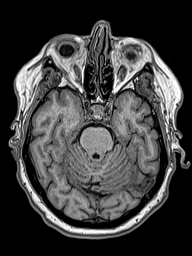
[im 67/160]
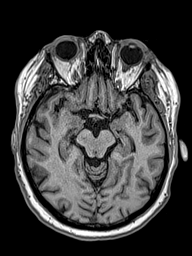
[im 80/160]
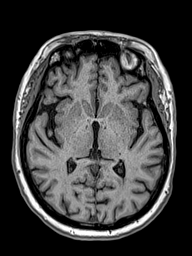
[im 93/160]
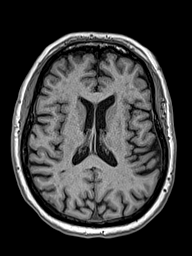
[im 107/160]
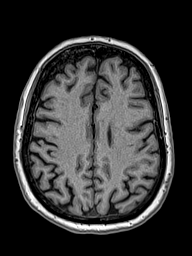
[im 120/160]
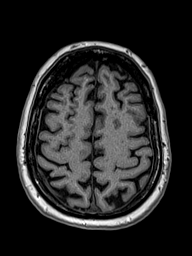
[im 133/160]
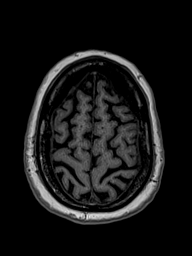
[im 146/160]
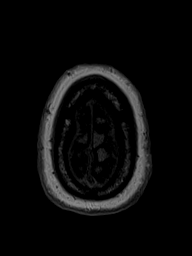
[im 160/160]
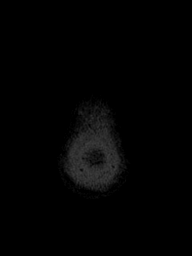

[Series 12: FLAIR · axial · 3.0mm · 0.75mm/px · z∈[-0,+149]mm · 4 of 52 slices shown]
[im 1/52]
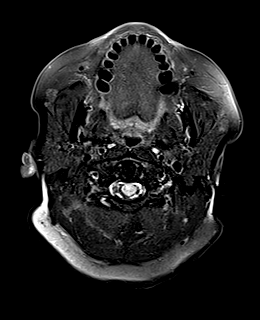
[im 18/52]
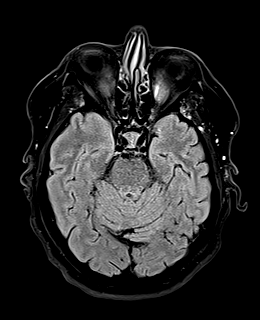
[im 35/52]
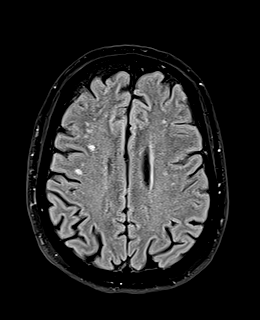
[im 52/52]
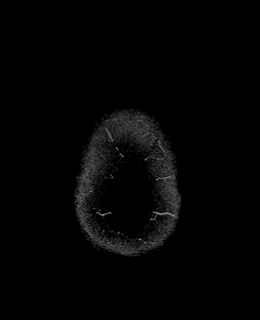

[Series 13: DWI · coronal · 5.0mm · 1.31mm/px · 5 of 68 slices shown (3 of 4)]
[im 1/68]
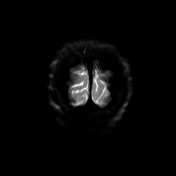
[im 17/68]
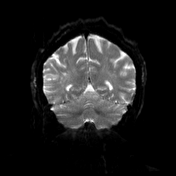
[im 34/68]
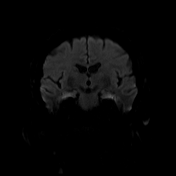
[im 51/68]
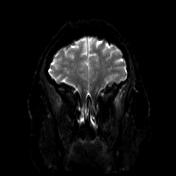
[im 68/68]
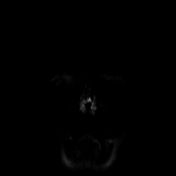

[Series 14: DWI · coronal · 5.0mm · 1.31mm/px · 3 of 34 slices shown (4 of 4)]
[im 1/34]
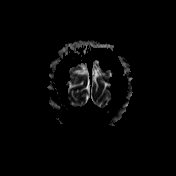
[im 17/34]
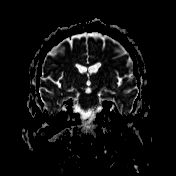
[im 34/34]
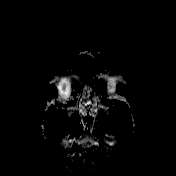

[Series 15: T2 · coronal · 5.0mm · 0.57mm/px · 2 of 30 slices shown (2 of 2)]
[im 1/30]
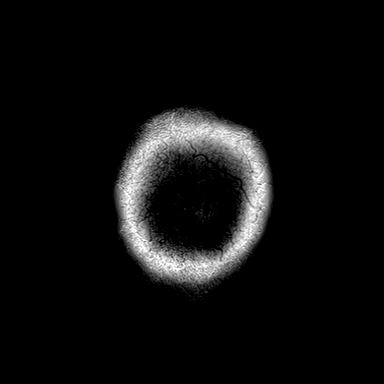
[im 30/30]
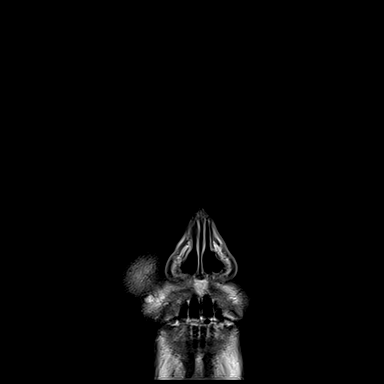

[48 of 48 positions shown; findings below may reference images not displayed]

FINDINGS: MRI HEAD FINDINGS

Brain: No acute infarction, hemorrhage, hydrocephalus, extra-axial
collection or mass lesion. Small FLAIR hyperintensities scattered in
the cerebral white matter, usually chronic microvascular
ischemia-mild for age. Preserved brain volume

Vascular: Major flow voids are preserved.

Skull and upper cervical spine: Normal marrow signal. Degenerative
facet spurring in the covered cervical spine.

Sinuses/Orbits: Negative

MRA HEAD FINDINGS

Vessels are smooth and diffusely patent. Negative for branch
occlusion or beading. Negative for aneurysm.

MRA NECK FINDINGS

Normal appearance of the arch. Three vessel branching. Carotid and
vertebral arteries are smoothly contoured and diffusely patent with
antegrade flow.
IMPRESSION: Brain MRI:

No acute or subacute insult.

Mild for age chronic microvascular ischemia.

Neck and intracranial MRA:

Unremarkable.  No vertebrobasilar insufficiency.

## 2021-08-27 IMAGING — MR MR MRA HEAD W/O CM
1 series · 21 of 48 positions shown · non-contrast
Comparison: None Available.

CLINICAL DATA: Headache, new or worsening. Positional
lightheadedness



[Series 4: TOF · axial · 0.6mm · 0.35mm/px · z∈[+8,+102]mm · 21 of 172 slices shown]
[im 1/172]
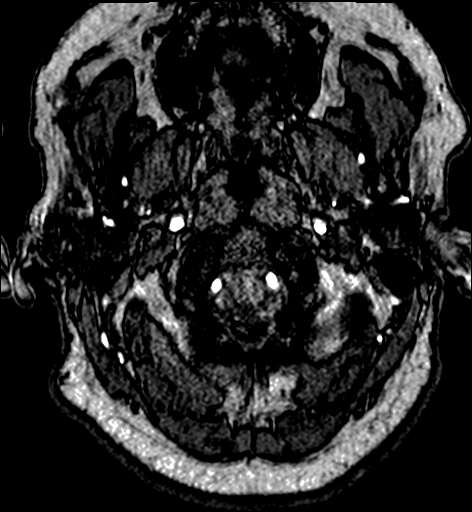
[im 4/172]
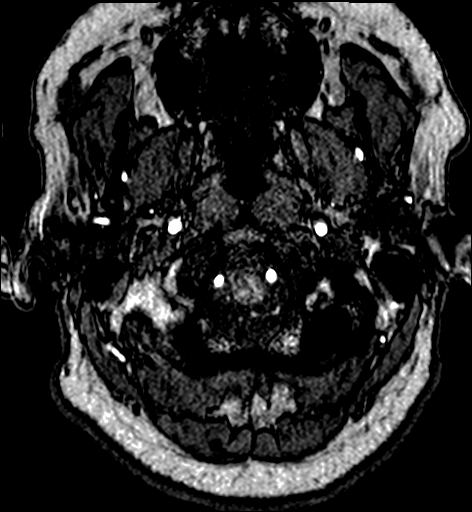
[im 8/172]
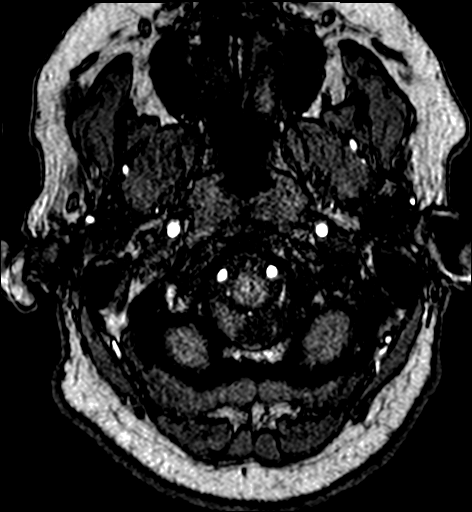
[im 11/172]
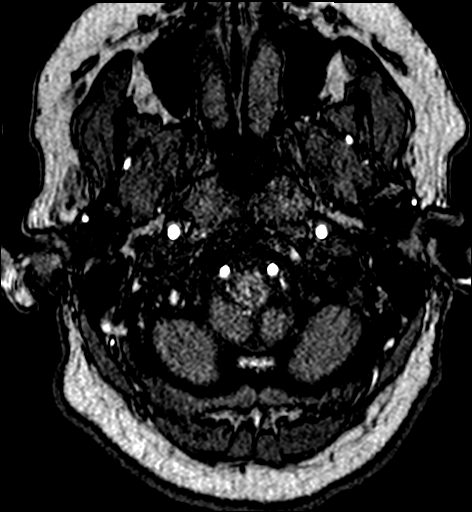
[im 15/172]
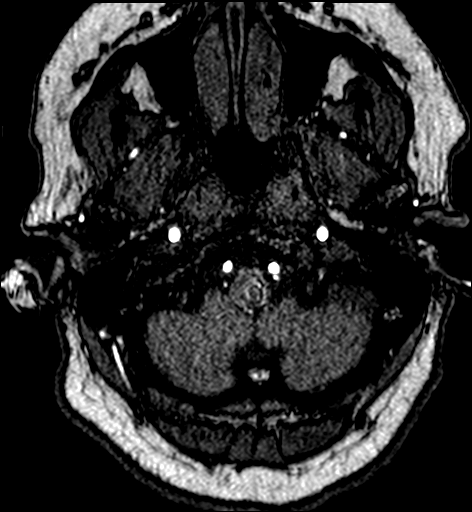
[im 19/172]
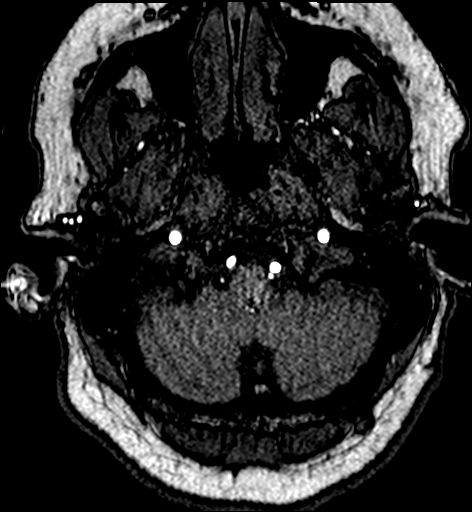
[im 22/172]
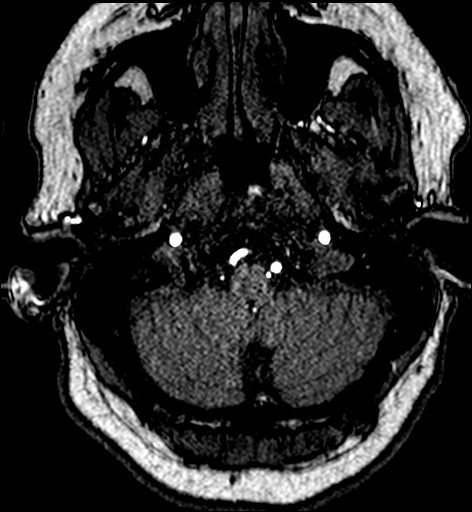
[im 26/172]
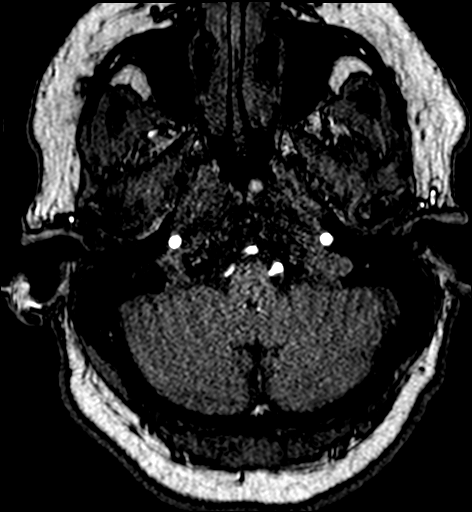
[im 30/172]
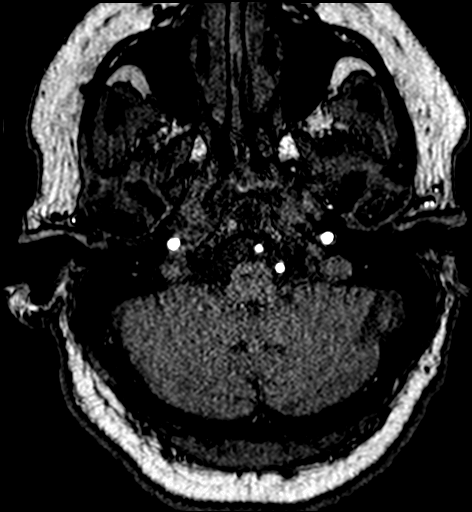
[im 33/172]
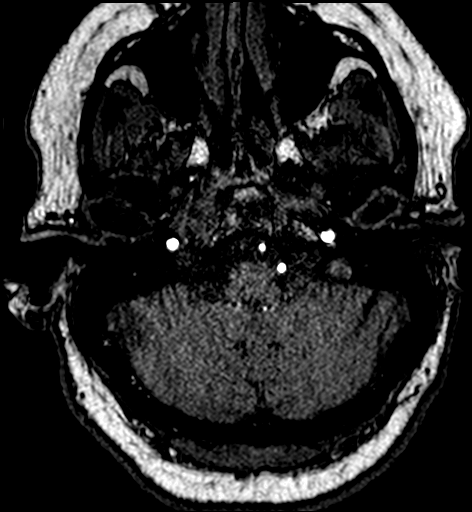
[im 37/172]
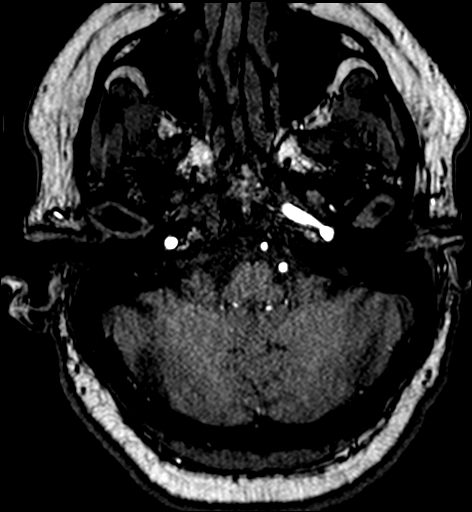
[im 41/172]
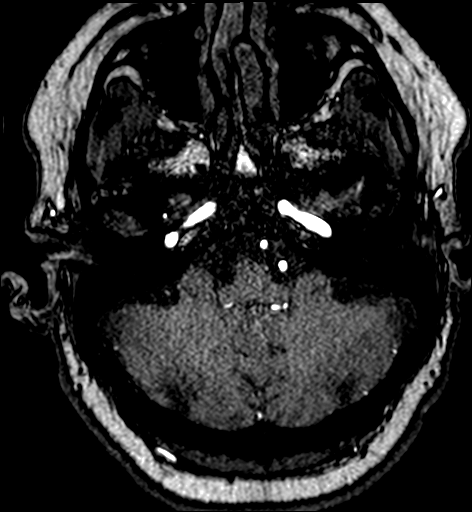
[im 44/172]
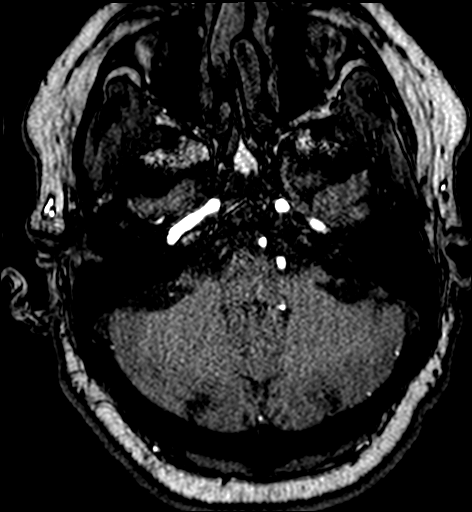
[im 55/172]
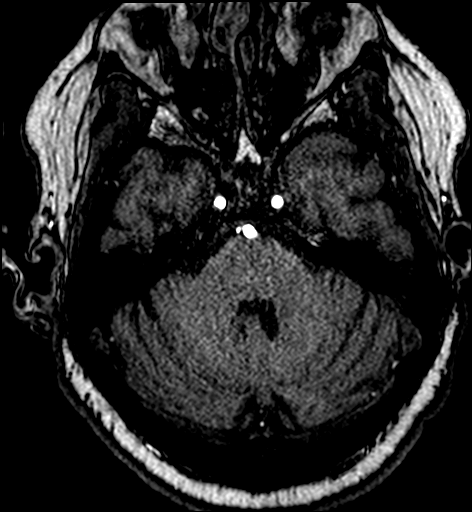
[im 77/172]
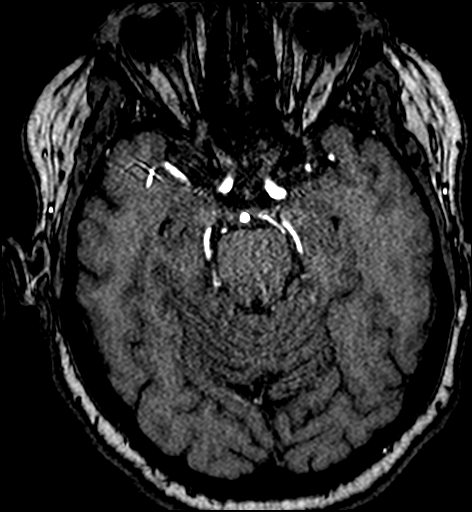
[im 88/172]
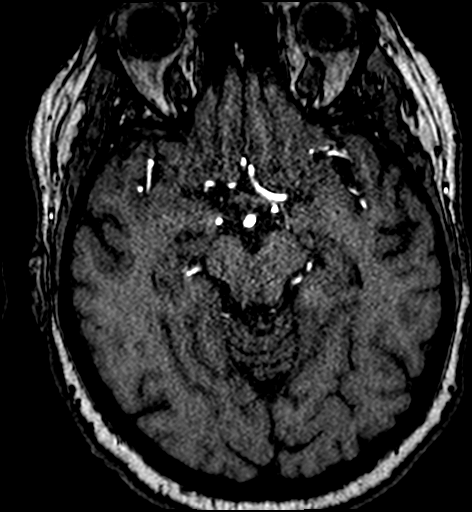
[im 99/172]
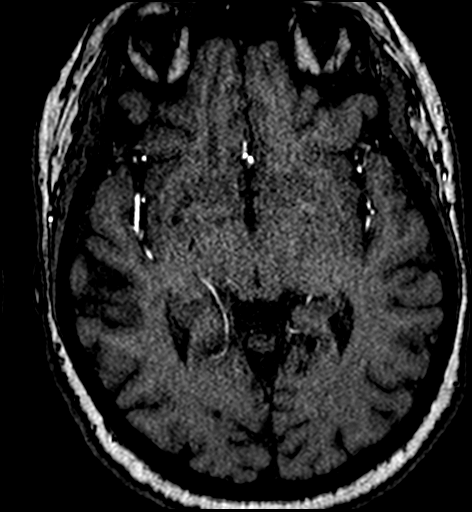
[im 121/172]
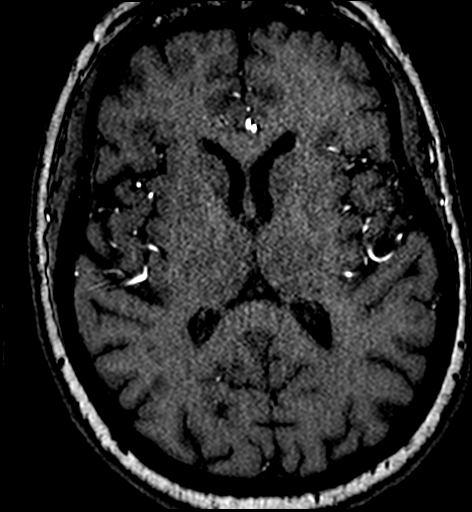
[im 142/172]
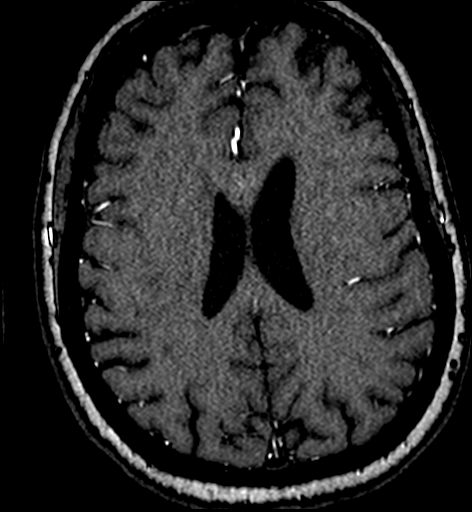
[im 146/172]
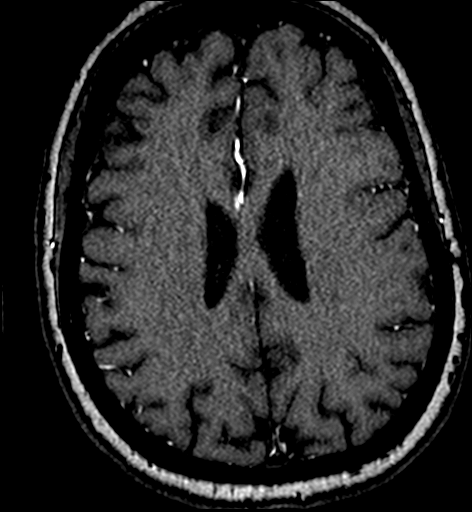
[im 164/172]
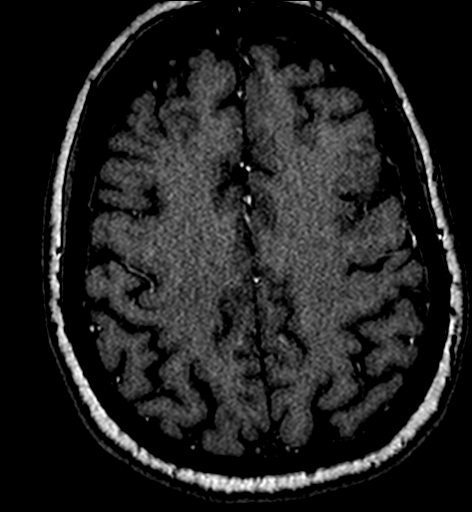

[21 of 48 positions shown; findings below may reference images not displayed]

FINDINGS: MRI HEAD FINDINGS

Brain: No acute infarction, hemorrhage, hydrocephalus, extra-axial
collection or mass lesion. Small FLAIR hyperintensities scattered in
the cerebral white matter, usually chronic microvascular
ischemia-mild for age. Preserved brain volume

Vascular: Major flow voids are preserved.

Skull and upper cervical spine: Normal marrow signal. Degenerative
facet spurring in the covered cervical spine.

Sinuses/Orbits: Negative

MRA HEAD FINDINGS

Vessels are smooth and diffusely patent. Negative for branch
occlusion or beading. Negative for aneurysm.

MRA NECK FINDINGS

Normal appearance of the arch. Three vessel branching. Carotid and
vertebral arteries are smoothly contoured and diffusely patent with
antegrade flow.
IMPRESSION: Brain MRI:

No acute or subacute insult.

Mild for age chronic microvascular ischemia.

Neck and intracranial MRA:

Unremarkable.  No vertebrobasilar insufficiency.

## 2021-08-27 IMAGING — MR MR MRA NECK W/O CM
5 series · 19 of 48 positions shown · non-contrast
Comparison: None Available.

CLINICAL DATA: Headache, new or worsening. Positional
lightheadedness



[Series 3: tof_2d_tra include arch · axial · 3.5mm · 0.43mm/px · z∈[-189,+27]mm · 15 of 94 slices shown (1 of 2)]
[im 1/94]
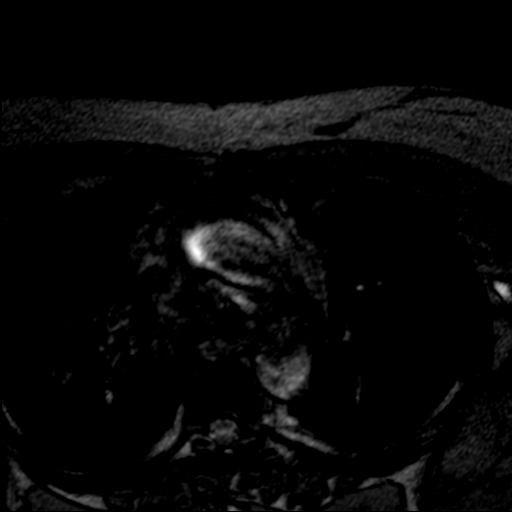
[im 3/94]
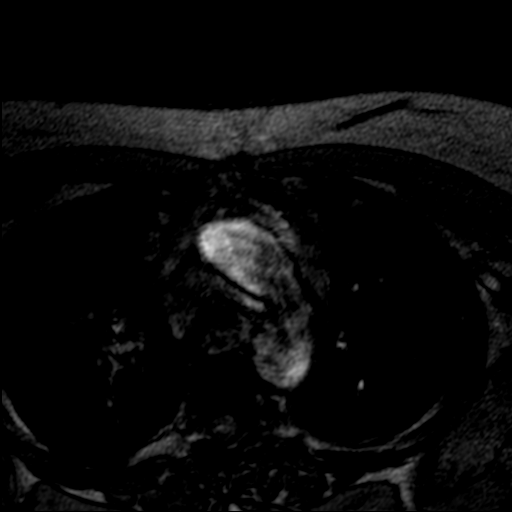
[im 5/94]
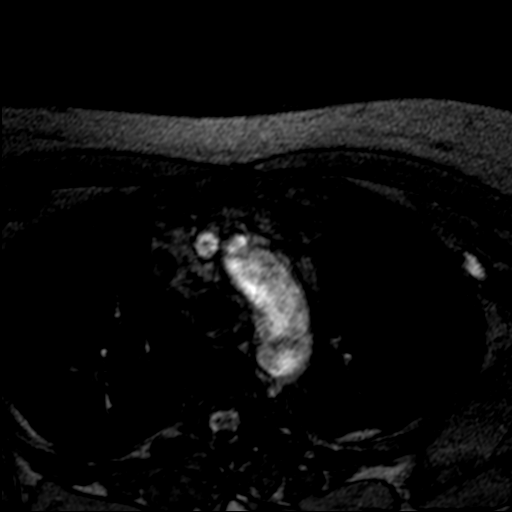
[im 7/94]
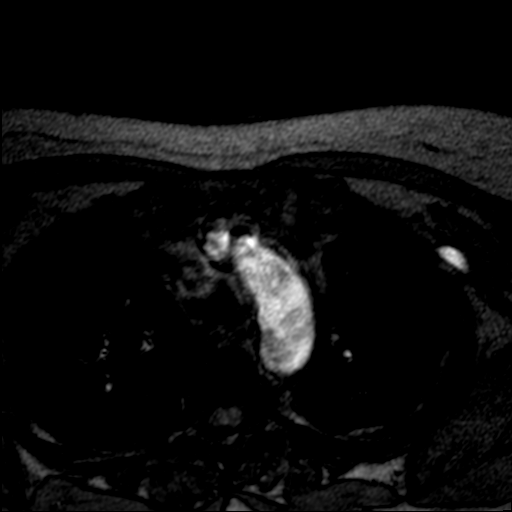
[im 9/94]
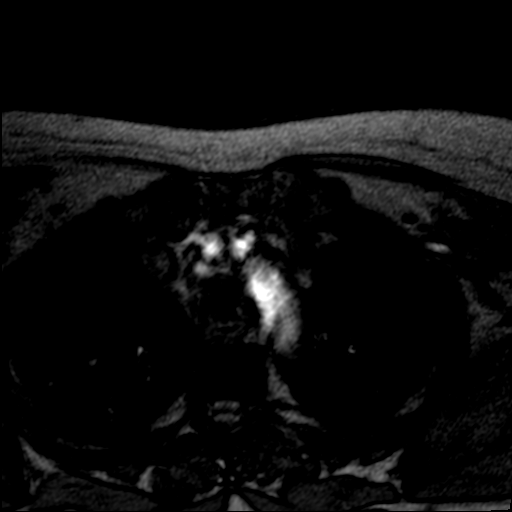
[im 16/94]
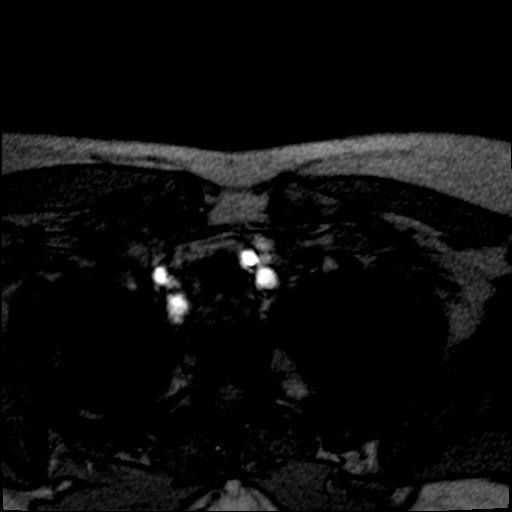
[im 18/94]
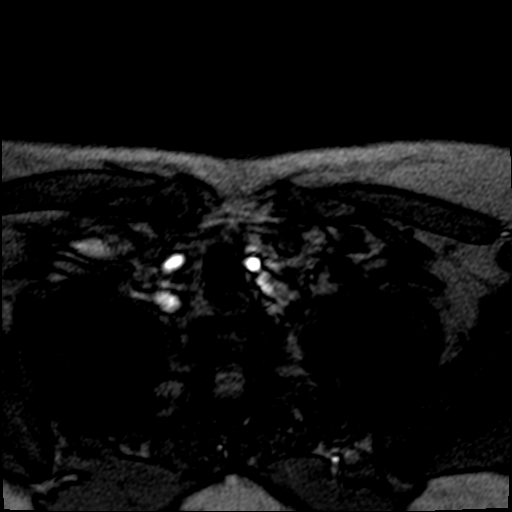
[im 29/94]
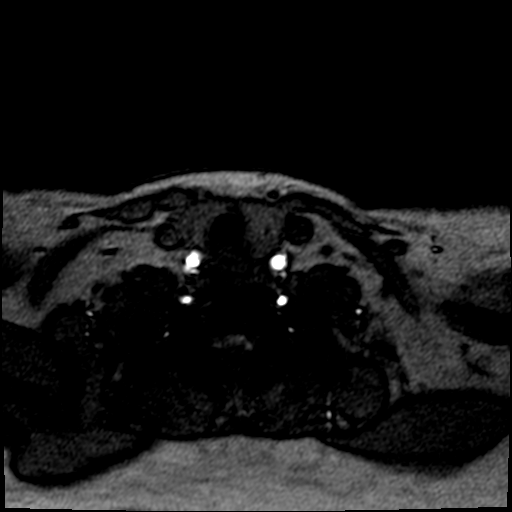
[im 42/94]
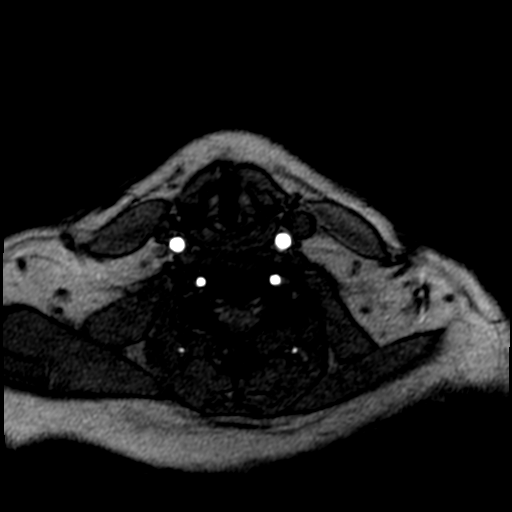
[im 48/94]
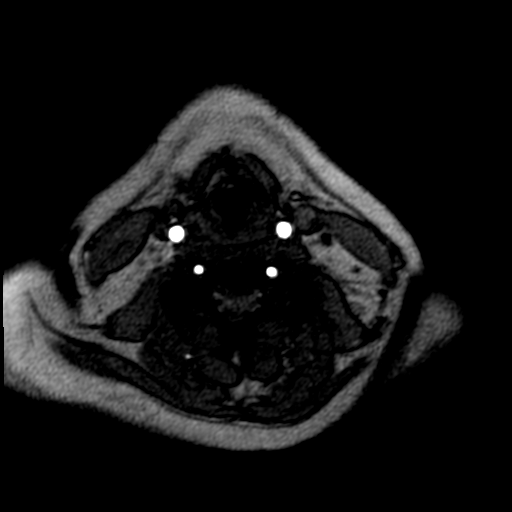
[im 52/94]
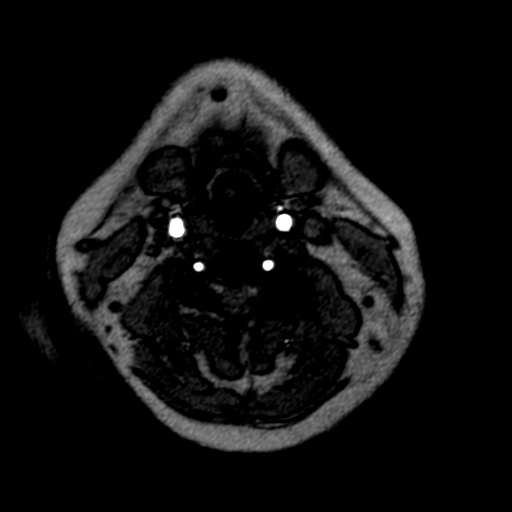
[im 65/94]
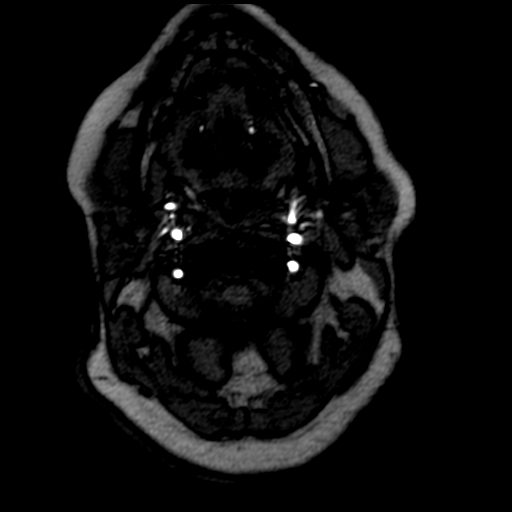
[im 76/94]
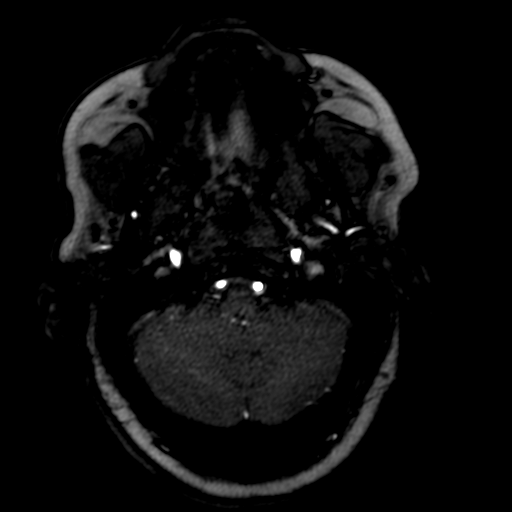
[im 78/94]
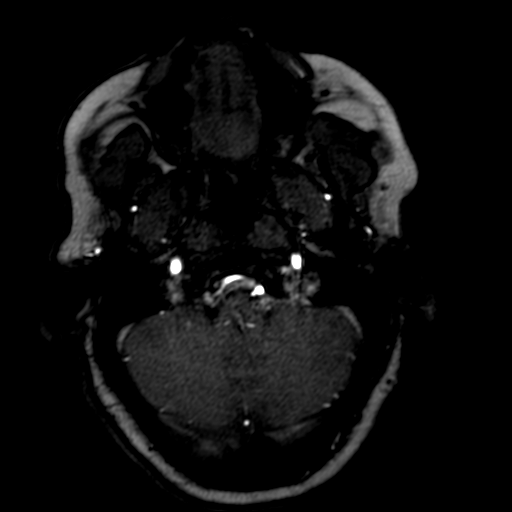
[im 89/94]
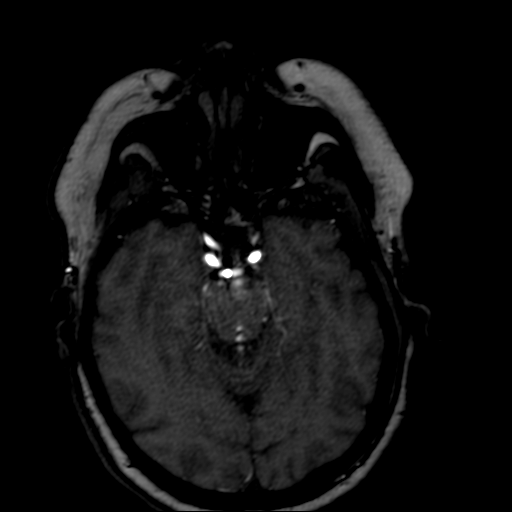

[Series 6: tof_2d_tra include arch · axial · 231.4mm · 0.43mm/px · 1 of 1 slices shown (2 of 2)]
[im 1/1]
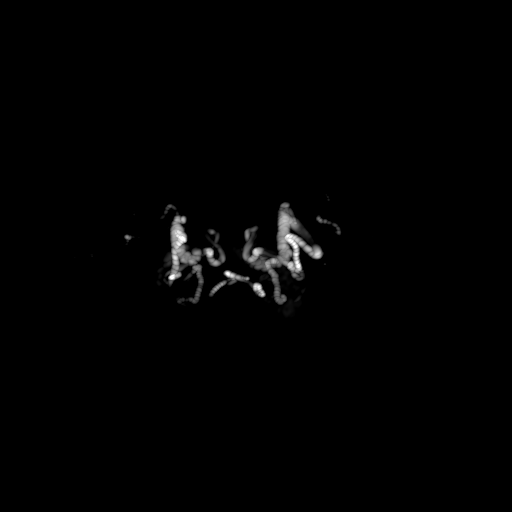

[Series 101: rt carotid · axial · 0.48mm/px · 1 of 1 slices shown]
[im 1/1]
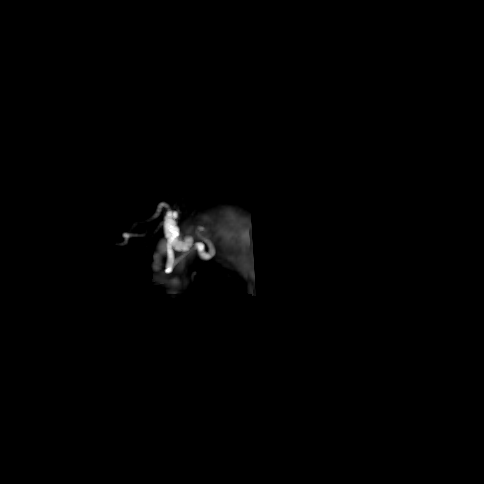

[Series 102: lt carotid · axial · 0.48mm/px · 1 of 1 slices shown]
[im 1/1]
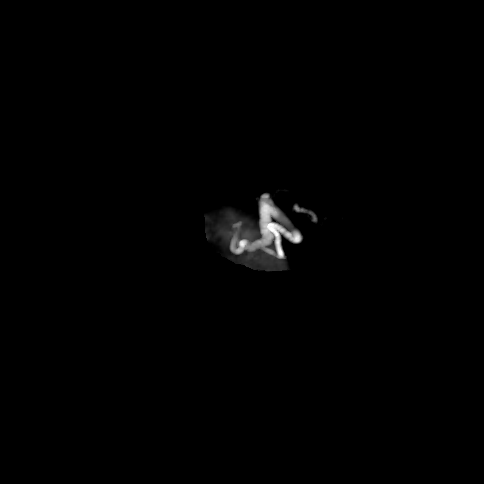

[Series 103: vertebrals · axial · 0.48mm/px · 1 of 1 slices shown]
[im 1/1]
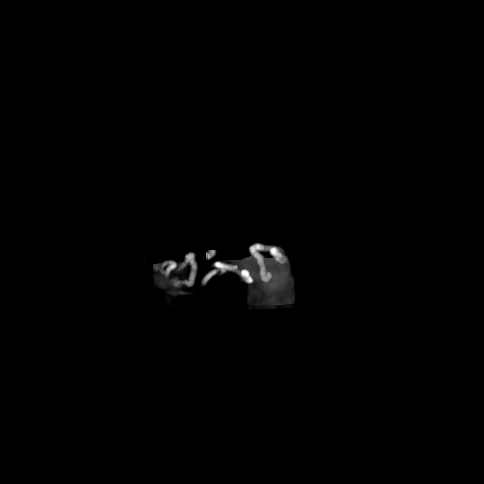

[19 of 48 positions shown; findings below may reference images not displayed]

FINDINGS: MRI HEAD FINDINGS

Brain: No acute infarction, hemorrhage, hydrocephalus, extra-axial
collection or mass lesion. Small FLAIR hyperintensities scattered in
the cerebral white matter, usually chronic microvascular
ischemia-mild for age. Preserved brain volume

Vascular: Major flow voids are preserved.

Skull and upper cervical spine: Normal marrow signal. Degenerative
facet spurring in the covered cervical spine.

Sinuses/Orbits: Negative

MRA HEAD FINDINGS

Vessels are smooth and diffusely patent. Negative for branch
occlusion or beading. Negative for aneurysm.

MRA NECK FINDINGS

Normal appearance of the arch. Three vessel branching. Carotid and
vertebral arteries are smoothly contoured and diffusely patent with
antegrade flow.
IMPRESSION: Brain MRI:

No acute or subacute insult.

Mild for age chronic microvascular ischemia.

Neck and intracranial MRA:

Unremarkable.  No vertebrobasilar insufficiency.

## 2021-09-04 ENCOUNTER — Ambulatory Visit: Payer: Medicare HMO | Admitting: Physician Assistant

## 2021-09-23 ENCOUNTER — Other Ambulatory Visit: Payer: Self-pay | Admitting: *Deleted

## 2021-09-23 MED ORDER — ATORVASTATIN CALCIUM 20 MG PO TABS: ORAL_TABLET | ORAL | 2 refills | Status: DC

## 2021-10-22 ENCOUNTER — Telehealth: Payer: Self-pay | Admitting: Physician Assistant

## 2021-10-22 NOTE — Telephone Encounter (Signed)
Copied from [facility] [id number]. Topic: Medicare AWV >> Oct 22, 2021  1:31 PM Devoria Glassing wrote: Reason for CRM: LVM 10/22/21 to r/s AWV appt 10/31/21.  New AWV appt date 10/28/21, please confirm appt change-khc

## 2021-10-24 ENCOUNTER — Ambulatory Visit (INDEPENDENT_AMBULATORY_CARE_PROVIDER_SITE_OTHER): Payer: Medicare HMO

## 2021-10-24 DIAGNOSIS — Z0000 Encounter for general adult medical examination without abnormal findings: Secondary | ICD-10-CM | POA: Diagnosis not present

## 2021-10-24 NOTE — Patient Instructions (Signed)
[name] , Thank you for taking time to come for your Medicare Wellness Visit. I appreciate your ongoing commitment to your health goals. Please review the following plan we discussed and let me know if I can assist you in the future.   Screening recommendations/referrals: Colonoscopy: postpone at this time  Mammogram: discontinued  Bone Density: postponed at this time  Recommended yearly ophthalmology/optometry visit for glaucoma screening and checkup Recommended yearly dental visit for hygiene and checkup  Vaccinations: Influenza vaccine: declined  Pneumococcal vaccine: due  Tdap vaccine: done 08/29/14 repeat every 10 years  Shingles vaccine: completed 12/28/20 & 04/14/21   Covid-19:completed 3/1, 3/30, 01/04/20 & 01/11/21  Advanced directives: Advance directive discussed with you today. I have provided a copy for you to complete at home and have notarized. Once this is complete please bring a copy in to our office so we can scan it into your chart.  Conditions/risks identified: be more active and lose weight   Next appointment: Follow up in one year for your annual wellness visit    Preventive Care 65 Years and Older, Female Preventive care refers to lifestyle choices and visits with your health care provider that can promote health and wellness. What does preventive care include? A yearly physical exam. This is also called an annual well check. Dental exams once or twice a year. Routine eye exams. Ask your health care provider how often you should have your eyes checked. Personal lifestyle choices, including: Daily care of your teeth and gums. Regular physical activity. Eating a healthy diet. Avoiding tobacco and drug use. Limiting alcohol use. Practicing safe sex. Taking low-dose aspirin every day. Taking vitamin and mineral supplements as recommended by your health care provider. What happens during an annual well check? The services and screenings done by your health  care provider during your annual well check will depend on your age, overall health, lifestyle risk factors, and family history of disease. Counseling  Your health care provider may ask you questions about your: Alcohol use. Tobacco use. Drug use. Emotional well-being. Home and relationship well-being. Sexual activity. Eating habits. History of falls. Memory and ability to understand (cognition). Work and work Statistician. Reproductive health. Screening  You may have the following tests or measurements: Height, weight, and BMI. Blood pressure. Lipid and cholesterol levels. These may be checked every 5 years, or more frequently if you are over 51 years old. Skin check. Lung cancer screening. You may have this screening every year starting at age 50 if you have a 30-pack-year history of smoking and currently smoke or have quit within the past 15 years. Fecal occult blood test (FOBT) of the stool. You may have this test every year starting at age 44. Flexible sigmoidoscopy or colonoscopy. You may have a sigmoidoscopy every 5 years or a colonoscopy every 10 years starting at age 28. Hepatitis C blood test. Hepatitis B blood test. Sexually transmitted disease (STD) testing. Diabetes screening. This is done by checking your blood sugar (glucose) after you have not eaten for a while (fasting). You may have this done every 1-3 years. Bone density scan. This is done to screen for osteoporosis. You may have this done starting at age 13. Mammogram. This may be done every 1-2 years. Talk to your health care provider about how often you should have regular mammograms. Talk with your health care provider about your test results, treatment options, and if necessary, the need for more tests. Vaccines  Your health care provider may recommend certain vaccines,  such as: Influenza vaccine. This is recommended every year. Tetanus, diphtheria, and acellular pertussis (Tdap, Td) vaccine. You may need a Td  booster every 10 years. Zoster vaccine. You may need this after age 57. Pneumococcal 13-valent conjugate (PCV13) vaccine. One dose is recommended after age 22. Pneumococcal polysaccharide (PPSV23) vaccine. One dose is recommended after age 41. Talk to your health care provider about which screenings and vaccines you need and how often you need them. This information is not intended to replace advice given to you by your health care provider. Make sure you discuss any questions you have with your health care provider. Document Released: 04/12/2015 Document Revised: 12/04/2015 Document Reviewed: 01/15/2015 Elsevier Interactive Patient Education  2017 [place] Prevention in the Home Falls can cause injuries. They can happen to people of all ages. There are many things you can do to make your home safe and to help prevent falls. What can I do on the outside of my home? Regularly fix the edges of walkways and driveways and fix any cracks. Remove anything that might make you trip as you walk through a door, such as a raised step or threshold. Trim any bushes or trees on the path to your home. Use bright outdoor lighting. Clear any walking paths of anything that might make someone trip, such as rocks or tools. Regularly check to see if handrails are loose or broken. Make sure that both sides of any steps have handrails. Any raised decks and porches should have guardrails on the edges. Have any leaves, snow, or ice cleared regularly. Use sand or salt on walking paths during winter. Clean up any spills in your garage right away. This includes oil or grease spills. What can I do in the bathroom? Use night lights. Install grab bars by the toilet and in the tub and shower. Do not use towel bars as grab bars. Use non-skid mats or decals in the tub or shower. If you need to sit down in the shower, use a plastic, non-slip stool. Keep the floor dry. Clean up any water that spills on the floor  as soon as it happens. Remove soap buildup in the tub or shower regularly. Attach bath mats securely with double-sided non-slip rug tape. Do not have throw rugs and other things on the floor that can make you trip. What can I do in the bedroom? Use night lights. Make sure that you have a light by your bed that is easy to reach. Do not use any sheets or blankets that are too big for your bed. They should not hang down onto the floor. Have a firm chair that has side arms. You can use this for support while you get dressed. Do not have throw rugs and other things on the floor that can make you trip. What can I do in the kitchen? Clean up any spills right away. Avoid walking on wet floors. Keep items that you use a lot in easy-to-reach places. If you need to reach something above you, use a strong step stool that has a grab bar. Keep electrical cords out of the way. Do not use floor polish or wax that makes floors slippery. If you must use wax, use non-skid floor wax. Do not have throw rugs and other things on the floor that can make you trip. What can I do with my stairs? Do not leave any items on the stairs. Make sure that there are handrails on both sides of the stairs and  use them. Fix handrails that are broken or loose. Make sure that handrails are as long as the stairways. Check any carpeting to make sure that it is firmly attached to the stairs. Fix any carpet that is loose or worn. Avoid having throw rugs at the top or bottom of the stairs. If you do have throw rugs, attach them to the floor with carpet tape. Make sure that you have a light switch at the top of the stairs and the bottom of the stairs. If you do not have them, ask someone to add them for you. What else can I do to help prevent falls? Wear shoes that: Do not have high heels. Have rubber bottoms. Are comfortable and fit you well. Are closed at the toe. Do not wear sandals. If you use a stepladder: Make sure that it is  fully opened. Do not climb a closed stepladder. Make sure that both sides of the stepladder are locked into place. Ask someone to hold it for you, if possible. Clearly mark and make sure that you can see: Any grab bars or handrails. First and last steps. Where the edge of each step is. Use tools that help you move around (mobility aids) if they are needed. These include: Canes. Walkers. Scooters. Crutches. Turn on the lights when you go into a dark area. Replace any light bulbs as soon as they burn out. Set up your furniture so you have a clear path. Avoid moving your furniture around. If any of your floors are uneven, fix them. If there are any pets around you, be aware of where they are. Review your medicines with your doctor. Some medicines can make you feel dizzy. This can increase your chance of falling. Ask your doctor what other things that you can do to help prevent falls. This information is not intended to replace advice given to you by your health care provider. Make sure you discuss any questions you have with your health care provider. Document Released: 01/10/2009 Document Revised: 08/22/2015 Document Reviewed: 04/20/2014 Elsevier Interactive Patient Education  2017 [employer].

## 2021-10-24 NOTE — Progress Notes (Addendum)
Virtual Visit via Telephone Note  I connected with  Sherri Smith on 10/24/21 at 10:30 AM EDT by telephone and verified that I am speaking with the correct person using two identifiers.  Medicare Annual Wellness visit completed telephonically due to Covid-19 pandemic.   Persons participating in this call: This Health Coach and this patient.   Location: Patient: home Provider: office    I discussed the limitations, risks, security and privacy concerns of performing an evaluation and management service by telephone and the availability of in person appointments. The patient expressed understanding and agreed to proceed.  Unable to perform video visit due to video visit attempted and failed and/or patient does not have video capability.   Some vital signs may be absent or patient reported.   Willette Brace, LPN   Subjective:   Sherri Smith is a 76 y.o. female who presents for Medicare Annual (Subsequent) preventive examination.  Review of Systems     Cardiac Risk Factors include: advanced age (>14mn, >>52women);obesity (BMI >30kg/m2);dyslipidemia     Objective:    There were no vitals filed for this visit. There is no height or weight on file to calculate BMI.     10/24/2021   10:36 AM 10/18/2020   10:57 AM 11/13/2016   12:40 PM 02/05/2014    3:04 PM  Advanced Directives  Does Patient Have a Medical Advance Directive? Yes Yes No No  Type of Advance Directive  Living will    Does patient want to make changes to medical advance directive? Yes (MAU/Ambulatory/Procedural Areas - Information given)     Would patient like information on creating a medical advance directive?    No - patient declined information    Current Medications (verified) Outpatient Encounter Medications as of 10/24/2021  Medication Sig   atorvastatin (LIPITOR) 20 MG tablet TAKE 1 TABLET(20 MG) BY MOUTH DAILY   Multiple Vitamins-Minerals (MULTIVITAMIN WITH MINERALS) tablet Take 1 tablet by mouth  every evening.   sertraline (ZOLOFT) 50 MG tablet Take 1 tablet (50 mg total) by mouth daily. (Patient taking differently: Take 50 mg by mouth every other day.)   VITAMIN D PO Take 125 mcg by mouth. K 2 included   No facility-administered encounter medications on file as of 10/24/2021.    Allergies (verified) Patient has no known allergies.   History: Past Medical History:  Diagnosis Date   Hyperlipidemia    Past Surgical History:  Procedure Laterality Date   APPENDECTOMY     OVARIAN CYST REMOVAL     1972   Family History  Problem Relation Age of Onset   Breast cancer Mother    Osteoarthritis Mother    Depression Mother    Heart disease Mother    Prostate cancer Father    Osteoarthritis Father    Hyperlipidemia Father    Hypertension Father    Heart disease Maternal Grandmother    Heart disease Maternal Grandfather    Social History   Socioeconomic History   Marital status: Married    Spouse name: Not on file   Number of children: Not on file   Years of education: Not on file   Highest education level: Not on file  Occupational History   Not on file  Tobacco Use   Smoking status: Never   Smokeless tobacco: Never  Vaping Use   Vaping Use: Never used  Substance and Sexual Activity   Alcohol use: Yes    Alcohol/week: 4.0 standard drinks of alcohol    Types:  4 Glasses of wine per week    Comment: one glass 5-6x/week   Drug use: Never   Sexual activity: Not on file  Other Topics Concern   Not on file  Social History Narrative   From the [place]   Retired    Moved to [place] to be closer to her daughter   Social Determinants of Radio broadcast assistant Strain: Low Risk  (10/24/2021)   Overall Financial Resource Strain (CARDIA)    Difficulty of Paying Living Expenses: Not hard at all  Food Insecurity: No Food Insecurity (10/24/2021)   Hunger Vital Sign    Worried About Running Out of Food in the Last Year: Never true    Ran Out of Food in the Last Year:  Never true  Transportation Needs: No Transportation Needs (10/24/2021)   PRAPARE - Hydrologist (Medical): No    Lack of Transportation (Non-Medical): No  Physical Activity: Insufficiently Active (10/24/2021)   Exercise Vital Sign    Days of Exercise per Week: 3 days    Minutes of Exercise per Session: 20 min  Stress: No Stress Concern Present (10/24/2021)   [place]    Feeling of Stress : Only a little  Social Connections: Moderately Integrated (10/24/2021)   Social Connection and Isolation Panel [NHANES]    Frequency of Communication with Friends and Family: More than three times a week    Frequency of Social Gatherings with Friends and Family: More than three times a week    Attends Religious Services: 1 to 4 times per year    Active Member of [employer] or Organizations: No    Attends Music therapist: Never    Marital Status: Married    Tobacco Counseling Counseling given: Not Answered   Clinical Intake:  Pre-visit preparation completed: Yes  Pain : No/denies pain     BMI - recorded: 39.14 Nutritional Status: BMI > 30  Obese Nutritional Risks: None Diabetes: No  How often do you need to have someone help you when you read instructions, pamphlets, or other written materials from your doctor or pharmacy?: 1 - Never  Diabetic?no  Interpreter Needed?: No  Information entered by :: Charlott Rakes, LPN   Activities of Daily Living    10/24/2021   10:38 AM  In your present state of health, do you have any difficulty performing the following activities:  Hearing? 0  Vision? 0  Difficulty concentrating or making decisions? 0  Walking or climbing stairs? 0  Dressing or bathing? 0  Doing errands, shopping? 0  Preparing Food and eating ? N  Using the Toilet? N  In the past six months, have you accidently leaked urine? Y  Comment wears a pad  Do you have problems  with loss of bowel control? N  Managing your Medications? N  Managing your Finances? N  Housekeeping or managing your Housekeeping? N    Patient Care Team: [name], [place] as PCP - General (Physician Assistant)  Indicate any recent Medical Services you may have received from other than [facility] providers in the past year (date may be approximate).     Assessment:   This is a routine wellness examination for Sherri Smith.  Hearing/Vision screen Hearing Screening - Comments:: Pt denies any hearing  issues  Vision Screening - Comments:: Pt will follow up with Dr Baldo Ash for annual eye exams   Dietary issues and exercise activities discussed: Current Exercise Habits:  Home exercise routine, Type of exercise: Other - see comments (recumbent bike), Time (Minutes): 30, Frequency (Times/Week): 3, Weekly Exercise (Minutes/Week): 90   Goals Addressed             This Visit's Progress    Patient Stated       Be more active and lose weight        Depression Screen    10/24/2021   10:33 AM 06/17/2021   10:53 AM 10/18/2020   10:54 AM 06/28/2020   11:08 AM  PHQ 2/9 Scores  PHQ - 2 Score 0 '2 1 4  '$ PHQ- 9 Score  10  20    Fall Risk    10/24/2021   10:38 AM 10/18/2020   10:59 AM 06/28/2020   11:14 AM  Fall Risk   Falls in the past year? 0 0 0  Number falls in past yr: 0 0 0  Injury with Fall? 0 0 0  Risk for fall due to : Impaired vision;Impaired balance/gait Impaired vision   Risk for fall due to: Comment with vertigo at times    Follow up Falls prevention discussed Falls prevention discussed     FALL RISK PREVENTION PERTAINING TO THE HOME:  Any stairs in or around the home? No  If so, are there any without handrails? No  Home free of loose throw rugs in walkways, pet beds, electrical cords, etc? Yes  Adequate lighting in your home to reduce risk of falls? Yes   ASSISTIVE DEVICES UTILIZED TO PREVENT FALLS:  Life alert? No  Use of a cane, walker or w/c? No  Grab bars in the  bathroom? No  Shower chair or bench in shower? No  Elevated toilet seat or a handicapped toilet? No   TIMED UP AND GO:  Was the test performed? No .   Cognitive Function:        10/24/2021   10:43 AM 10/18/2020   11:04 AM  6CIT Screen  What Year? 0 points 0 points  What month? 0 points 0 points  What time? 0 points 0 points  Count back from 20 0 points 0 points  Months in reverse 0 points 2 points  Repeat phrase 0 points 0 points  Total Score 0 points 2 points    Immunizations Immunization History  Administered Date(s) Administered   PFIZER(Purple Top)SARS-COV-2 Vaccination 05/29/2019, 06/27/2019, 01/04/2020   Pfizer Covid-19 Vaccine Bivalent Booster 82yr & up 01/11/2021   Pneumococcal Polysaccharide-23 11/12/2011, 03/29/2020   Tdap 08/29/2014   Zoster Recombinat (Shingrix) 01/07/2021, 04/14/2021    TDAP status: Up to date  Flu Vaccine status: Declined, Education has been provided regarding the importance of this vaccine but patient still declined. Advised may receive this vaccine at local pharmacy or Health Dept. Aware to provide a copy of the vaccination record if obtained from local pharmacy or Health Dept. Verbalized acceptance and understanding.  Pneumococcal vaccine status: Due, Education has been provided regarding the importance of this vaccine. Advised may receive this vaccine at local pharmacy or Health Dept. Aware to provide a copy of the vaccination record if obtained from local pharmacy or Health Dept. Verbalized acceptance and understanding.  Covid-19 vaccine status: Completed vaccines  Qualifies for Shingles Vaccine? Yes   Zostavax completed Yes   Shingrix Completed?: Yes  Screening Tests Health Maintenance  Topic Date Due   COVID-19 Vaccine (5 - Pfizer series) 05/14/2021   Pneumonia Vaccine 75 Years old (2 - PCV) 06/18/2022 (Originally 03/29/2021)   Hepatitis C Screening  06/18/2022 (Originally 03/24/1964)   DEXA SCAN  10/25/2022 (Originally  03/24/2012)   COLONOSCOPY (Pts 45-54yr Insurance coverage will need to be confirmed)  10/25/2022 (Originally 03/24/1992)   INFLUENZA VACCINE  10/28/2021   TETANUS/TDAP  08/28/2024   Zoster Vaccines- Shingrix  Completed   HPV VACCINES  Aged Out   MAMMOGRAM  Discontinued    Health Maintenance  Health Maintenance Due  Topic Date Due   COVID-19 Vaccine (5 - Pfizer series) 05/14/2021    Colonoscopy postponed at this time   Mammogram status: No longer required due to discontinued .  Bone density postponed at this time   Additional Screening:  Hepatitis C Screening: does qualify;  Vision Screening: Recommended annual ophthalmology exams for early detection of glaucoma and other disorders of the eye. Is the patient up to date with their annual eye exam?  Yes  Who is the provider or what is the name of the office in which the patient attends annual eye exams? Dr CBaldo Ash If pt is not established with a provider, would they like to be referred to a provider to establish care? No .  Dental Screening: Recommended annual dental exams for proper oral hygiene  Community Resource Referral / Chronic Care Management: CRR required this visit?  No   CCM required this visit?  No      Plan:     I have personally reviewed and noted the following in the patient's chart:   Medical and social history Use of alcohol, tobacco or illicit drugs  Current medications and supplements including opioid prescriptions.  Functional ability and status Nutritional status Physical activity Advanced directives List of other physicians Hospitalizations, surgeries, and ER visits in previous 12 months Vitals Screenings to include cognitive, depression, and falls Referrals and appointments  In addition, I have reviewed and discussed with patient certain preventive protocols, quality metrics, and best practice recommendations. A written personalized care plan for preventive services as well as general  preventive health recommendations were provided to patient.     TWillette Brace LPN   [id number]  Nurse Notes: none

## 2021-10-28 ENCOUNTER — Ambulatory Visit: Payer: Medicare HMO

## 2021-10-31 ENCOUNTER — Ambulatory Visit: Payer: Medicare HMO

## 2021-11-11 ENCOUNTER — Encounter: Payer: Self-pay | Admitting: Psychology

## 2021-12-10 ENCOUNTER — Other Ambulatory Visit: Payer: Self-pay | Admitting: *Deleted

## 2021-12-10 MED ORDER — SERTRALINE HCL 50 MG PO TABS: 50.0000 mg | ORAL_TABLET | Freq: Every day | ORAL | 0 refills | Status: DC

## 2021-12-22 ENCOUNTER — Encounter: Payer: Self-pay | Admitting: *Deleted

## 2022-01-06 ENCOUNTER — Encounter: Payer: Self-pay | Admitting: Physician Assistant

## 2022-01-06 ENCOUNTER — Ambulatory Visit (INDEPENDENT_AMBULATORY_CARE_PROVIDER_SITE_OTHER): Payer: Medicare HMO | Admitting: Physician Assistant

## 2022-01-06 VITALS — BP 124/80 | HR 60 | Temp 97.7°F | Ht 64.0 in | Wt 230.4 lb

## 2022-01-06 DIAGNOSIS — M79641 Pain in right hand: Secondary | ICD-10-CM | POA: Diagnosis not present

## 2022-01-06 DIAGNOSIS — M65342 Trigger finger, left ring finger: Secondary | ICD-10-CM

## 2022-01-06 DIAGNOSIS — R251 Tremor, unspecified: Secondary | ICD-10-CM

## 2022-01-06 DIAGNOSIS — E782 Mixed hyperlipidemia: Secondary | ICD-10-CM | POA: Diagnosis not present

## 2022-01-06 MED ORDER — MELOXICAM 7.5 MG PO TABS: 7.5000 mg | ORAL_TABLET | Freq: Every day | ORAL | 0 refills | Status: DC

## 2022-01-06 NOTE — Progress Notes (Signed)
Sherri Smith is a 76 y.o. female here for a new problem.  History of Present Illness:   Chief Complaint  Patient presents with   Hand Pain    Pt c/o bilateral hand pain for long time, worse past 6 months. Right hand has tingling and numbness some days unable to use hand. Left hand thumb joint painful and ring finger on left hand she can not bend.    HPI  Right hand pain: She complains of tingling feeling on her right hand, stiffness, and sometimes nerve shock that radiates to her finger. She states that this has been going on for a while, but it has worsen in the last month. She reports that her pain sometimes affects her daily routine. She is having consistent pain at night and during the day, but worse at night. She takes ibuprofen for pain. She wore carpal tunnel brace, but it did not help. She has tried CBD ointment, which temporarily relieves her pain.   Tremor on left hand: she reports she noticed the tremor about one year ago. She has trouble holding a cup with liquid with her left hand. Denies concerns with balance, HA, vision.  Trigger finger: he has trouble bending her left ring finger. This has been going on for years.  Hyperlipidemia: She is regularly taking Lipitor 20 mg to manage her cholesterol. She has family hx of heart attack. She would like further evaluation. Lab Results  Component Value Date   CHOL 139 08/26/2021   HDL 47.60 08/26/2021   LDLCALC 60 08/26/2021   LDLDIRECT 117.0 06/28/2020   TRIG 156.0 (H) 08/26/2021   CHOLHDL 3 08/26/2021     Past Medical History:  Diagnosis Date   Hyperlipidemia      Social History   Tobacco Use   Smoking status: Never   Smokeless tobacco: Never  Vaping Use   Vaping Use: Never used  Substance Use Topics   Alcohol use: Yes    Alcohol/week: 4.0 standard drinks of alcohol    Types: 4 Glasses of wine per week    Comment: one glass 5-6x/week   Drug use: Never    Past Surgical History:  Procedure Laterality Date    APPENDECTOMY     OVARIAN CYST REMOVAL     1972    Family History  Problem Relation Age of Onset   Breast cancer Mother    Osteoarthritis Mother    Depression Mother    Heart disease Mother    Prostate cancer Father    Osteoarthritis Father    Hyperlipidemia Father    Hypertension Father    Heart disease Maternal Grandmother    Heart disease Maternal Grandfather     No Known Allergies  Current Medications:   Current Outpatient Medications:    atorvastatin (LIPITOR) 20 MG tablet, TAKE 1 TABLET(20 MG) BY MOUTH DAILY, Disp: 90 tablet, Rfl: 2   meloxicam (MOBIC) 7.5 MG tablet, Take 1 tablet (7.5 mg total) by mouth daily., Disp: 30 tablet, Rfl: 0   Multiple Vitamins-Minerals (MULTIVITAMIN WITH MINERALS) tablet, Take 1 tablet by mouth every evening., Disp: , Rfl:    sertraline (ZOLOFT) 50 MG tablet, Take 1 tablet (50 mg total) by mouth daily., Disp: 90 tablet, Rfl: 0   VITAMIN D PO, Take 125 mcg by mouth. K 2 included, Disp: , Rfl:    Review of Systems:   Review of Systems  Neurological:  Positive for tingling and tremors.       (+) Tingling feeling on her right  hand. (+) right hand stiffness. (+) nerve shock that radiates to her finger (+) Trigger finger    Vitals:   Vitals:   01/06/22 1053  BP: 124/80  Pulse: 60  Temp: 97.7 F (36.5 C)  TempSrc: Temporal  SpO2: 96%  Weight: 230 lb 6.1 oz (104.5 kg)  Height: '5\' 4"'$  (1.626 m)     Body mass index is 39.54 kg/m.  Physical Exam:   Physical Exam Constitutional:      General: She is not in acute distress.    Appearance: Normal appearance. She is not ill-appearing.  HENT:     Head: Normocephalic and atraumatic.  Musculoskeletal:     Comments: Normal ROM of R hand Decreased ROM with flexion of left hand 4th finger No obvious swelling  Skin:    General: Skin is warm and dry.  Neurological:     Mental Status: She is alert and oriented to person, place, and time.     Comments: Grip strength 5/5 b/l Normal  sensation to b/l hands  Slight resting tremor of L hand with outstretched; none in right  Psychiatric:        Judgment: Judgment normal.     Assessment and Plan:   Right hand pain Suspect OA, possible carpal tunnel Will treat with daily mobic 7.5 mg daily x a couple weeks and then prn Hand exercises provided on handout Follow-up with sports medicine if lack of improvement or worsening  Tremor of left hand Discussed option to send to neurology She would like to continue to monitor this and see if she has any worsening or new sx and then she will reach out  Mixed hyperlipidemia Will order calcium score for evaluation   Trigger finger, left ring finger Referral to sports medicine  I,Param Shah,acting as a scribe for [name], PA.,have documented all relevant documentation on the behalf of [name], PA,as directed by  [name], PA while in the presence of [name], [place].  I, [name], [place], have reviewed all documentation for this visit. The documentation on 01/06/22 for the exam, diagnosis, procedures, and orders are all accurate and complete.   [name], PA-C

## 2022-01-06 NOTE — Patient Instructions (Addendum)
It was great to see you!  Start meloxicam 7.5 mg daily x 2 weeks Then as needed after that  A referral has been placed for you to see one of our fantastic providers at [place]. Someone from their office will be in touch soon regarding scheduling your appointment.  Their location:  [place] [facility]  [address] on the 1st floor Phone number [phone] Fax [phone].   This location is across the street from the entrance to [employer] and in the same complex as the [facility]   Consider ice or wrist braces.

## 2022-01-07 NOTE — Progress Notes (Signed)
I, Sherri Smith, LAT, ATC acting as a scribe for Sherri Leader, MD.  Subjective:    CC: Bilat hand pain  HPI: Pt is a 76 y/o female c/o bilat hand pain is chronic in nature, worsening over the last 6 months.  Pt is R-hand dominate.  On her left hand, she has triggering of the 4th finger that she unable to bend. Pt c/o stiffness and tingling in her R hand w/ shooting "nerve pains" into the 2nd-3rd fingers  Grip strength: weakened Numbness/tingling: yes- L  Aggravates: fine motor, gripping Treatments tried: IBU, carpal tunnel brace, CBD ointment, HEP  Pertinent review of Systems: No fevers or chills  Relevant historical information: Depression   Objective:    Vitals:   01/08/22 0849  BP: (!) 144/92  Pulse: 60  SpO2: 98%   General: Well Developed, well nourished, and in no acute distress.   MSK: Right hand and wrist: Normal appearing Normal motion and strength. Positive Tinel's at carpal tunnel.  Positive Phalen's test.  Pulses cap refill and sensation are intact distally.  Left hand and wrist: Swelling at fourth PIP.  Decreased motion at fourth PIP limited flexion. Negative Tinel's at carpal tunnel.  Lab and Radiology Results  Procedure: Real-time Ultrasound Guided median nerve hydrodissection at right carpal tunnel Device: Philips Affiniti 50G Images permanently stored and available for review in PACS Verbal informed consent obtained.  Discussed risks and benefits of procedure. Warned about infection, bleeding, hyperglycemia damage to structures among others. Patient expresses understanding and agreement Time-out conducted.   Noted no overlying erythema, induration, or other signs of local infection.   Skin prepped in a sterile fashion.   Local anesthesia: Topical Ethyl chloride.   With sterile technique and under real time ultrasound guidance: 40 mg of Kenalog and 2 mL of lidocaine injected into carpal tunnel surrounding the right median nerve. Fluid seen  entering the carpal tunnel.   Completed without difficulty   Pain immediately resolved suggesting accurate placement of the medication.   Advised to call if fevers/chills, erythema, induration, drainage, or persistent bleeding.   Images permanently stored and available for review in the ultrasound unit.  Impression: Technically successful ultrasound guided injection.    X-ray images bilateral hands obtained today personally and independently interpreted  Right hand: Mild degenerative changes.  No acute fractures.  No erosions are present.  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  Mild degenerative changes otherwise.  No acute fractures are present.  Await formal radiology review    Impression and Recommendations:    Assessment and Plan: 76 y.o. female with right carpal tunnel and left fourth PIP lack of range of motion.  Right carpal tunnel syndrome.  Failing early conservative management trial of night splint.  We will continue night splint.  Add gabapentin at bedtime.  Trial of carpal tunnel injection today.  We will add occupational hand therapy.  If not sufficient proceed with nerve conduction study for surgical planning.  Left fourth PIP stiffness and pain due to DJD.  Plan for occupational hand therapy.  Check back in 6 weeks.  If not improved consider injection.  Recheck 6 weeks.  Marland Kitchen  PDMP not reviewed this encounter. Orders Placed This Encounter  Procedures   [place] LIMITED JOINT SPACE STRUCTURES UP BILAT(NO LINKED CHARGES)    Order Specific Question:   Reason for Exam (SYMPTOM  OR DIAGNOSIS REQUIRED)    Answer:   Bilateral hand pain    Order Specific Question:   Preferred  imaging location?    Answer:   [place]   DG Hand Complete Left    Standing Status:   Future    Number of Occurrences:   1    Standing Expiration Date:   02/08/2022    Order Specific Question:   Reason for Exam (SYMPTOM  OR DIAGNOSIS REQUIRED)    Answer:    bilateral hand pain    Order Specific Question:   Preferred imaging location?    Answer:   [facility]   DG Hand Complete Right    Standing Status:   Future    Number of Occurrences:   1    Standing Expiration Date:   02/08/2022    Order Specific Question:   Reason for Exam (SYMPTOM  OR DIAGNOSIS REQUIRED)    Answer:   bilateral hand pain    Order Specific Question:   Preferred imaging location?    Answer:   [facility]   Ambulatory referral to Occupational Therapy    Referral Priority:   Routine    Referral Type:   Occupational Therapy    Referral Reason:   Specialty Services Required    Requested Specialty:   Occupational Therapy    Number of Visits Requested:   1   Meds ordered this encounter  Medications   gabapentin (NEURONTIN) 100 MG capsule    Sig: Take 1 capsule (100 mg total) by mouth at bedtime.    Dispense:  30 capsule    Refill:  3    Discussed warning signs or symptoms. Please see discharge instructions. Patient expresses understanding.  The above documentation has been reviewed and is accurate and complete Sherri Smith, M.D.

## 2022-01-08 ENCOUNTER — Ambulatory Visit (INDEPENDENT_AMBULATORY_CARE_PROVIDER_SITE_OTHER): Payer: Medicare HMO

## 2022-01-08 ENCOUNTER — Ambulatory Visit: Payer: Self-pay

## 2022-01-08 ENCOUNTER — Ambulatory Visit: Payer: Medicare HMO | Admitting: Family Medicine

## 2022-01-08 VITALS — BP 144/92 | HR 60 | Ht 64.0 in | Wt 231.0 lb

## 2022-01-08 DIAGNOSIS — G5601 Carpal tunnel syndrome, right upper limb: Secondary | ICD-10-CM | POA: Diagnosis not present

## 2022-01-08 DIAGNOSIS — M19049 Primary osteoarthritis, unspecified hand: Secondary | ICD-10-CM | POA: Diagnosis not present

## 2022-01-08 DIAGNOSIS — M79642 Pain in left hand: Secondary | ICD-10-CM

## 2022-01-08 DIAGNOSIS — M19042 Primary osteoarthritis, left hand: Secondary | ICD-10-CM | POA: Diagnosis not present

## 2022-01-08 DIAGNOSIS — M19041 Primary osteoarthritis, right hand: Secondary | ICD-10-CM | POA: Diagnosis not present

## 2022-01-08 DIAGNOSIS — M79641 Pain in right hand: Secondary | ICD-10-CM

## 2022-01-08 MED ORDER — GABAPENTIN 100 MG PO CAPS: 100.0000 mg | ORAL_CAPSULE | Freq: Every day | ORAL | 3 refills | Status: DC

## 2022-01-08 NOTE — Patient Instructions (Addendum)
Thank you for coming in today.   I've referred you to Occupational Therapy for hand therapy.  Let us know if you don't hear from them in one week.   You received an injection today. Seek immediate medical attention if the joint becomes red, extremely painful, or is oozing fluid.   Please get an Xray today before you leave   I've sent a prescription for Gabapentin to your pharmacy.   Check back in 6 weeks

## 2022-01-12 NOTE — Progress Notes (Signed)
Left hand x-ray shows some arthritis changes.

## 2022-01-12 NOTE — Progress Notes (Signed)
Right hand x-ray shows some arthritis changes.

## 2022-01-13 ENCOUNTER — Ambulatory Visit: Payer: Medicare HMO | Admitting: Rehabilitative and Restorative Service Providers"

## 2022-01-13 ENCOUNTER — Encounter: Payer: Self-pay | Admitting: Rehabilitative and Restorative Service Providers"

## 2022-01-13 ENCOUNTER — Other Ambulatory Visit: Payer: Self-pay

## 2022-01-13 DIAGNOSIS — R600 Localized edema: Secondary | ICD-10-CM

## 2022-01-13 DIAGNOSIS — M25642 Stiffness of left hand, not elsewhere classified: Secondary | ICD-10-CM

## 2022-01-13 DIAGNOSIS — M6281 Muscle weakness (generalized): Secondary | ICD-10-CM | POA: Diagnosis not present

## 2022-01-13 DIAGNOSIS — M79641 Pain in right hand: Secondary | ICD-10-CM

## 2022-01-13 DIAGNOSIS — M25531 Pain in right wrist: Secondary | ICD-10-CM

## 2022-01-13 DIAGNOSIS — R202 Paresthesia of skin: Secondary | ICD-10-CM

## 2022-01-13 DIAGNOSIS — R278 Other lack of coordination: Secondary | ICD-10-CM

## 2022-01-13 DIAGNOSIS — M25542 Pain in joints of left hand: Secondary | ICD-10-CM

## 2022-01-13 DIAGNOSIS — M25641 Stiffness of right hand, not elsewhere classified: Secondary | ICD-10-CM

## 2022-01-13 DIAGNOSIS — M25532 Pain in left wrist: Secondary | ICD-10-CM

## 2022-01-13 NOTE — Therapy (Signed)
OUTPATIENT OCCUPATIONAL THERAPY ORTHO EVALUATION  Patient Name: Sherri Smith MRN: [id number] DOB:1945/09/27, 76 y.o., female Today's Date: 01/13/2022  PCP: [name] PA REFERRING PROVIDER: Gregor Hams, MD   OT End of Session - 01/13/22 1301     Visit Number 1    Date for OT Re-Evaluation 02/27/22    Authorization Type [employer]    OT Start Time 1302    OT Stop Time 1432    OT Time Calculation (min) 90 min    Equipment Utilized During Treatment buddy strap    Activity Tolerance Patient tolerated treatment well;No increased pain;Patient limited by fatigue;Patient limited by pain    Behavior During Therapy [facility] for tasks assessed/performed             Past Medical History:  Diagnosis Date   Hyperlipidemia    Past Surgical History:  Procedure Laterality Date   APPENDECTOMY     OVARIAN CYST REMOVAL     1972   Patient Active Problem List   Diagnosis Date Noted   Carpal tunnel syndrome, right 01/08/2022   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   Anxiety 06/28/2020   Depression, recurrent ([place]) 06/28/2020   OSA (obstructive sleep apnea) 06/28/2020   Cataracts, bilateral 06/28/2020    ONSET DATE: chronic (1+ year)  REFERRING DIAG: M79.641,M79.642 (ICD-10-CM) - Bilateral hand pain  THERAPY DIAG:  Localized edema  Muscle weakness (generalized)  Other lack of coordination  Pain in left wrist  Stiffness of left hand, not elsewhere classified  Pain in joint of left hand  Pain in right hand  Paresthesia of skin  Pain in right wrist  Stiffness of right hand, not elsewhere classified  Rationale for Evaluation and Treatment Rehabilitation  SUBJECTIVE:   SUBJECTIVE STATEMENT: She states Lt hand base of thumb joint is constantly painful and RF is stiff for unknown reasons (but was tender, swollen in past).  Rt hand has been "excruciating" pain that started with tingling, numbness, stiffness with "shooting" nerve pain in IF and MF.  These things have  gotten worse over the past 6 months. Her rt hand also wakes her up in the night with pain (throbbing, aching, not sharp/shooting). 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  More recently, she had had such pain that she states she could not even hold toilet paper. She states "teaching herself to be left handed." She had cortisone injection 01/08/22 from Dr. Georgina Snell, which has made a "huge" difference, but she still ha tingling and stiffness. She has pre-fab wrist cock-up brace from years ago, the she she recently started wearing again at night.    PERTINENT HISTORY: Per MD: "Occupational Therapy Evaluate and Treat for bilat hand pain. 1-2 times per week for 4-6 weeks.  Decrease pain, increase strength, flexibility, function, and range of motion. Modalities may include, traction, ionto, phono, stim, and dry needling prn."  PRECAUTIONS: She states mild-moderate allergy to adhesive on skin   WEIGHT BEARING RESTRICTIONS No  PAIN:  Are you having pain? Yes Rating: 2-3/10 in Lt thumb now  FALLS: Has patient fallen in last 6 months? No  LIVING ENVIRONMENT: Lives with: lives with their spouse who has recently been helping her due to pain in hands.   PLOF: Independent  PATIENT GOALS: To have better function and understanding of issues, less pain   OBJECTIVE:   HAND DOMINANCE: Right  ADLs: Overall ADLs: States decreased ability to grab, hold household objects, pain and inability to open containers, perform FMS tasks (manipulate fasteners on clothing)   FUNCTIONAL OUTCOME MEASURES: Eval: Quck DASH 34% impairment today  (Higher % Score  =  More Impairment)     UPPER EXTREMITY ROM     Shoulder to Wrist AROM Right eval Left eval  Wrist flexion 65 61  Wrist extension 62 68  (Blank rows = not tested)   Hand AROM Right eval Left eval  Full Fist Ability (or Gap  to Distal Palmar Crease) Yes but loose fist due to tightness No, Lt RF has 4.8cm gap to [facility] with fist.   Thumb Opposition to Small Finger (or Gap) full full  Thumb Opposition to Base of Small Finger (or Gap)  full full  Thumb MCP (0-60)    Thumb IP (0-80)    Thumb Radial abd/add (0-55)     Thumb Palmar abd/add (0-45)     Ring MCP (0-90)   0 -  78  Ring PIP (0-100)    0-62  Ring DIP (0-70)   0-27   (Blank rows = not tested)   UPPER EXTREMITY MMT:    Eval:  NT at eval due to recent high pain and history of pain.  Will approach resistance when appropriate, pain-wise.   MMT Right TBD Left TBD  Elbow flexion    Elbow extension    Forearm supination    Forearm pronation    Wrist flexion    Wrist extension    Wrist ulnar deviation    Wrist radial deviation    (Blank rows = not tested)  HAND FUNCTION: Eval: Observed weakness in affected hand.  Grip strength Right: 51 lbs, Left: 31 lbs   COORDINATION: Eval: Observed coordination impairments with affected hand. 9 Hole Peg Test Right: 23sec (diminished), Left: sec 21.6sec (WFL)  SENSATION: Eval: static 2PD: Lt hand has solid 541m discrimination sense in ulnar and median nerves, Rt hand has  between 4-578msense in median nerve and 41m10mn ulnar nerve. Some c/o hypersensitivity in Rt hand with testing.   EDEMA:   Eval: Lt Rf PIP J slight swollen   OBSERVATIONS:   Eval: some CMC J zig-zag collapse Lt > Rt noted (from thumb OA), Lt RF PIP J is somewhat passively flexible and can tolerate stretch. Right hand history sounds like nerve pain and diffuse hand pain (possibly from overuse/OA) leading to disuse and stiffness.     OT impression:  4 main issues:  1- Lt thumb CMC J OA,  2- Lt RF PIP joint effusion with mild contracture,  3- Rt hand median nerve pain/hypersensitivity  4- stiffness of Rt hand due to disuse and likely OA   (also small concern for Rt hand CRPS- though not significantly swollen)   TODAY'S TREATMENT:  Post-evaluation  treatment:  To help manage self-care and symptoms, we discussed habits in relation to avoiding bent wrist, or repetitive motion or prolonged postures (helps manage nerves and OA). OT also provides neuro education about median neuropathy/compression, avoiding compression at neck, elbow, and wrist as able. We discussed sleep positions and OT gives recommendations for pre-fab orthotic wear. We also discussed first two HEP exercises to help improve Lt RF motion (as below). She does well with OT supervision, has no pain increase.  She is also given buddy strap to help encourage LT RF motion as well.   Exercises - HOOK Stretch  - 4 x daily - 3-5 reps -  15-20 sec hold - Seated Finger Composite Flexion Stretch  - 4 x daily - 3-5 reps - 15 hold   PATIENT EDUCATION: Education details: See tx section above for details  Person educated: Patient Education method: Verbal Instruction, Teach back, Handouts  Education comprehension: States and demonstrates understanding, Additional Education required    HOME EXERCISE PROGRAM: Access Code: [email or web address] URL: [email or web address] Date: 01/13/2022 Prepared by: Benito Mccreedy   GOALS: Goals reviewed with patient? Yes   SHORT TERM GOALS: (STG required if POC>30 days) Target Date: 01/30/22  Pt will obtain protective or mobilizing custom orthotic to help manage stiffness or protect painful joints, etc.  Goal status: INITIAL, PRN  2.  Pt will demo/state understanding of initial HEP to improve pain levels and prerequisite motion. Goal status: INITIAL   LONG TERM GOALS: Target Date: 02/27/22  Pt will improve functional ability by decreased impairment per Quick DASH assessment from 34% to 15% or better, for better quality of life. Goal status: INITIAL  2.  Pt will improve grip strength in Lt hand from 31lbs to at least 40lbs for functional use at home and in IADLs. Goal status: INITIAL  3.  Pt will improve A/ROM in Lt RF total active motion  (TAM) from 167* to at least 200*, to have functional motion for tasks like reach and grasp.  Goal status: INITIAL  4.  Pt will improve strength in Rt hand and wrist to at least 4+/5 MMT to have increased functional ability to carry out selfcare and higher-level homecare tasks with no difficulty. Goal status: INITIAL  5.  Pt will improve coordination skills in Rt dom hand, as seen by better score on 9HPT testing to 20sec or better, to have increased functional ability to carry out fine motor tasks (fasteners, etc.) and more complex, coordinated IADLs (meal prep, sports, etc.).  Goal status: INITIAL    ASSESSMENT:  CLINICAL IMPRESSION: Patient is a 76 y.o. 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 These things limit her function, and she will benefit from OP OT to improve function.   PERFORMANCE DEFICITS in functional skills including ADLs, IADLs, coordination, dexterity, proprioception, sensation, edema, ROM, strength, pain, fascial restrictions, flexibility, FMC, GMC, body mechanics, endurance, decreased knowledge of precautions, and UE functional use, cognitive skills including problem solving and safety awareness, and psychosocial skills including coping strategies, environmental adaptation, and habits.   IMPAIRMENTS are limiting patient from ADLs, IADLs, work, leisure, and social participation.   COMORBIDITIES has co-morbidities such as depression and anxiety  that affects occupational performance. Patient will benefit from skilled OT to address above impairments and improve overall function.  MODIFICATION OR ASSISTANCE TO COMPLETE EVALUATION: Min-Moderate modification of tasks or assist with assess necessary to complete an evaluation.  OT OCCUPATIONAL PROFILE AND HISTORY: Detailed assessment: Review of records and additional review of physical, cognitive,  psychosocial history related to current functional performance.  CLINICAL DECISION MAKING: High - multiple treatment options, significant modification of task necessary  REHAB POTENTIAL: Good  EVALUATION COMPLEXITY: Moderate      PLAN: OT FREQUENCY: 2x/week  OT DURATION: 6 weeks  PLANNED INTERVENTIONS: self care/ADL training, therapeutic exercise, therapeutic activity, neuromuscular re-education, manual therapy, passive range of motion, splinting, electrical stimulation, ultrasound, fluidotherapy, compression bandaging, moist heat, cryotherapy, contrast bath, patient/family education, energy conservation, coping strategies training, and DME and/or AE instructions  RECOMMENDED OTHER SERVICES: none  now   CONSULTED AND AGREED WITH PLAN OF CARE: Patient  PLAN FOR NEXT SESSION: Review initial HEP and recommendations, educate on additional HEP to manage symptoms, progress as tolerated. Consider relative motion orthotic to improve RF motion, and hand based thumb spica to rest painful thumb CMC J.    Benito Mccreedy, OTR/L, CHT 01/13/2022, 3:53 PM  Referring diagnosis? [phone] (ICD-10-CM) - Bilateral hand pain Treatment diagnosis? (if different than referring diagnosis) M25.542 , M25.641,  M62.81  What was this (referring dx) caused by? '[]'$  Surgery '[]'$  Fall '[x]'$  Ongoing issue '[x]'$  Arthritis '[]'$  Other: ____________  Laterality: '[]'$  Rt '[]'$  Lt '[x]'$  Both  Check all possible CPT codes:  *CHOOSE 10 OR LESS*    '[x]'$  97110 (Therapeutic Exercise)  '[]'$  92507 (SLP Treatment)  '[x]'$  97112 (Neuro Re-ed)   '[]'$  92526 (Swallowing Treatment)   '[]'$  97116 (Gait Training)   '[]'$  [id number] (Cognitive Training, 1st 15 minutes) '[x]'$  97140 (Manual Therapy)   '[]'$  97130 (Cognitive Training, each add'l 15 minutes)  '[]'$  97164 (Re-evaluation)                              '[]'$  Other, List CPT Code ____________  '[x]'$  97530 (Therapeutic Activities)     '[x]'$  97535 (Self Care)   '[]'$  All codes above (97110 - 97535)  '[]'$  97012  (Mechanical Traction)  '[x]'$  97014 (E-stim Unattended)  '[]'$  97032 (E-stim manual)  '[]'$  97033 (Ionto)  '[x]'$  97035 (Ultrasound) '[]'$  97750 (Physical Performance Training) '[]'$  [id number] (Aquatic Therapy) '[]'$  97016 (Vasopneumatic Device) '[]'$  [id number] (Paraffin) '[]'$  97034 (Contrast Bath) '[]'$  97597 (Wound Care 1st 20 sq cm) '[]'$  97598 (Wound Care each add'l 20 sq cm) '[x]'$  97760 (Orthotic Fabrication, Fitting, Training Initial) '[]'$  [id number] (Prosthetic Management and Training Initial) '[x]'$  [id number] (Orthotic or Prosthetic Training/ Modification Subsequent)

## 2022-01-14 DIAGNOSIS — G4733 Obstructive sleep apnea (adult) (pediatric): Secondary | ICD-10-CM | POA: Diagnosis not present

## 2022-01-19 ENCOUNTER — Encounter: Payer: Self-pay | Admitting: Rehabilitative and Restorative Service Providers"

## 2022-01-19 ENCOUNTER — Ambulatory Visit (INDEPENDENT_AMBULATORY_CARE_PROVIDER_SITE_OTHER): Payer: Medicare HMO | Admitting: Rehabilitative and Restorative Service Providers"

## 2022-01-19 DIAGNOSIS — R202 Paresthesia of skin: Secondary | ICD-10-CM | POA: Diagnosis not present

## 2022-01-19 DIAGNOSIS — R278 Other lack of coordination: Secondary | ICD-10-CM | POA: Diagnosis not present

## 2022-01-19 DIAGNOSIS — M25532 Pain in left wrist: Secondary | ICD-10-CM | POA: Diagnosis not present

## 2022-01-19 DIAGNOSIS — M25642 Stiffness of left hand, not elsewhere classified: Secondary | ICD-10-CM

## 2022-01-19 DIAGNOSIS — M6281 Muscle weakness (generalized): Secondary | ICD-10-CM

## 2022-01-19 DIAGNOSIS — M25542 Pain in joints of left hand: Secondary | ICD-10-CM

## 2022-01-19 DIAGNOSIS — R600 Localized edema: Secondary | ICD-10-CM

## 2022-01-19 DIAGNOSIS — M79641 Pain in right hand: Secondary | ICD-10-CM

## 2022-01-19 DIAGNOSIS — M25531 Pain in right wrist: Secondary | ICD-10-CM

## 2022-01-19 DIAGNOSIS — M25641 Stiffness of right hand, not elsewhere classified: Secondary | ICD-10-CM

## 2022-01-19 NOTE — Therapy (Signed)
OUTPATIENT OCCUPATIONAL THERAPY TREATMENT NOTE  Patient Name: Sherri Smith MRN: [id number] DOB:1945/11/20, 76 y.o., female Today's Date: 01/19/2022  PCP: [name] PA REFERRING PROVIDER: Gregor Hams, MD   OT End of Session - 01/19/22 1106     Visit Number 2    Number of Visits 12    Date for OT Re-Evaluation 02/27/22    Authorization Type [employer]    Progress Note Due on Visit 10    OT Start Time 1106    OT Stop Time 1149    OT Time Calculation (min) 43 min    Equipment Utilized During Treatment --    Activity Tolerance Patient tolerated treatment well;No increased pain;Patient limited by fatigue;Patient limited by pain    Behavior During Therapy [facility] for tasks assessed/performed              Past Medical History:  Diagnosis Date   Hyperlipidemia    Past Surgical History:  Procedure Laterality Date   APPENDECTOMY     OVARIAN CYST REMOVAL     1972   Patient Active Problem List   Diagnosis Date Noted   Carpal tunnel syndrome, right 01/08/2022   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   Anxiety 06/28/2020   Depression, recurrent ([place]) 06/28/2020   OSA (obstructive sleep apnea) 06/28/2020   Cataracts, bilateral 06/28/2020    ONSET DATE: chronic (1+ year)  REFERRING DIAG: M79.641,M79.642 (ICD-10-CM) - Bilateral hand pain  THERAPY DIAG:  Muscle weakness (generalized)  Pain in left wrist  Localized edema  Pain in joint of left hand  Stiffness of left hand, not elsewhere classified  Other lack of coordination  Pain in right hand  Pain in right wrist  Paresthesia of skin  Stiffness of right hand, not elsewhere classified  Rationale for Evaluation and Treatment Rehabilitation  PERTINENT HISTORY: Per MD: "Occupational Therapy Evaluate and Treat for bilat hand pain. 1-2 times per week for 4-6 weeks.  Decrease pain, increase strength, flexibility, function, and range of motion. Modalities may include, traction, ionto, phono, stim, and dry  needling prn." From eval: "She states Lt hand base of thumb joint is constantly painful and RF is stiff for unknown reasons (but was tender, swollen in past).  Rt hand has been "excruciating" pain that started with tingling, numbness, stiffness with "shooting" nerve pain in IF and MF.  These things have gotten worse over the past 6 months. Her rt hand also wakes her up in the night with pain (throbbing, aching, not sharp/shooting). 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  More recently, she had had such pain that she states she could not even hold toilet paper. She states "teaching herself to be left handed." She had cortisone injection 01/08/22 from Dr. Georgina Snell, which has made a "huge" difference, but she still ha tingling and stiffness. She has pre-fab wrist cock-up brace from years ago, the she she recently started wearing again at night. "  PRECAUTIONS: She states mild-moderate allergy to adhesive on skin   WEIGHT BEARING RESTRICTIONS No  SUBJECTIVE:   SUBJECTIVE STATEMENT: She states been trying to watch nerve compression, wearing pre-fab brace at night, etc. Has been stretching RF and that is not painful.   PAIN:  Are you having pain? Yes Rating: 2-3/10 in Lt thumb now    OBJECTIVE: (All objective assessments below are from initial evaluation on: 01/13/22 unless  otherwise specified.)    HAND DOMINANCE: Right   ADLs: Overall ADLs: States decreased ability to grab, hold household objects, pain and inability to open containers, perform FMS tasks (manipulate fasteners on clothing)   FUNCTIONAL OUTCOME MEASURES: Eval: Quck DASH 34% impairment today  (Higher % Score  =  More Impairment)     UPPER EXTREMITY ROM     Shoulder to Wrist AROM Right eval Left eval  Wrist flexion 65 61  Wrist extension 62 68  (Blank rows = not tested)   Hand AROM Right eval  Left eval Lt 01/19/22  Full Fist Ability (or Gap to Distal Palmar Crease) Yes but loose fist due to tightness No, Lt RF has 4.8cm gap to [facility] with fist.    Thumb Opposition to Small Finger (or Gap) full full   Thumb Opposition to Base of Small Finger (or Gap)  full full   Thumb MCP (0-60)     Thumb IP (0-80)     Thumb Radial abd/add (0-55)      Thumb Palmar abd/add (0-45)      Ring MCP (0-90)   0 -  78 0-78  Ring PIP (0-100)    0-62 0-64  Ring DIP (0-70)   0-27  0-29  (Blank rows = not tested)   UPPER EXTREMITY MMT:    Eval:  NT at eval due to recent high pain and history of pain.  Will approach resistance when appropriate, pain-wise.   MMT Right TBD Left TBD  Elbow flexion    Elbow extension    Forearm supination    Forearm pronation    Wrist flexion    Wrist extension    Wrist ulnar deviation    Wrist radial deviation    (Blank rows = not tested)  HAND FUNCTION: Eval: Observed weakness in affected hand.  Grip strength Right: 51 lbs, Left: 31 lbs   COORDINATION: Eval: Observed coordination impairments with affected hand. 9 Hole Peg Test Right: 23sec (diminished), Left: sec 21.6sec (WFL)  SENSATION: Eval: static 2PD: Lt hand has solid 37m discrimination sense in ulnar and median nerves, Rt hand has  between 4-51msense in median nerve and 32m51mn ulnar nerve. Some c/o hypersensitivity in Rt hand with testing.   EDEMA:   Eval: Lt Rf PIP J slight swollen   OBSERVATIONS:   Eval: some CMC J zig-zag collapse Lt > Rt noted (from thumb OA), Lt RF PIP J is somewhat passively flexible and can tolerate stretch. Right hand history sounds like nerve pain and diffuse hand pain (possibly from overuse/OA) leading to disuse and stiffness.     OT impression:  4 main issues:  1- Lt thumb CMC J OA,  2- Lt RF PIP joint effusion with mild contracture,  3- Rt hand median nerve pain/hypersensitivity  4- stiffness of Rt hand due to disuse and likely OA   (also small concern for Rt hand CRPS-  though not significantly swollen)   TODAY'S TREATMENT:  01/19/22: RF is a bit looser than at evaluation. She reviews hook and composite stretches.  OT also edu more on nerve management, teaches nerve glides in 2 ways for Rt median Nerve and she tolerates these very well.  To help manage  Lt thumb pain OT also edu on 2 stretches at the wrist and [facility](as [facility] and these are relieving to her. (New exercises are bolded).   Exercises - Median Nerve Flossing  - 4-6 x daily - 1 sets -  10-15 reps - Seated Median Nerve Glide  - 3-4 x daily - 5 reps - Wrist Flexion Stretch  - 4 x daily - 3-5 reps - 15 sec hold - Wrist Prayer Stretch  - 4 x daily - 3-5 reps - 15 sec hold - Tendon Glides  - 4-6 x daily - 3-5 reps - 2-3 seconds hold - HOOK Stretch  - 4 x daily - 3-5 reps - 15-20 sec hold - Seated Finger Composite Flexion Stretch  - 4 x daily - 3-5 reps - 15 hold - Stretch thumb downward   - 2-3 x daily - 3-5 reps - 15 sec hold - Stretch Thumb into "C-Shape" (don't use other thumb)  - 2-3 x daily - 3-5 reps - 15 sec hold   Post-evaluation treatment:  To help manage self-care and symptoms, we discussed habits in relation to avoiding bent wrist, or repetitive motion or prolonged postures (helps manage nerves and OA). OT also provides neuro education about median neuropathy/compression, avoiding compression at neck, elbow, and wrist as able. We discussed sleep positions and OT gives recommendations for pre-fab orthotic wear. We also discussed first two HEP exercises to help improve Lt RF motion (as below). She does well with OT supervision, has no pain increase.  She is also given buddy strap to help encourage LT RF motion as well.   Exercises - HOOK Stretch  - 4 x daily - 3-5 reps - 15-20 sec hold - Seated Finger Composite Flexion Stretch  - 4 x daily - 3-5 reps - 15 hold   PATIENT EDUCATION: Education details: See tx section above for details  Person educated: Patient Education  method: Verbal Instruction, Teach back, Handouts  Education comprehension: States and demonstrates understanding, Additional Education required    HOME EXERCISE PROGRAM: Access Code: [email or web address] URL: [email or web address] Date: 01/13/2022 Prepared by: Benito Mccreedy   GOALS: Goals reviewed with patient? Yes   SHORT TERM GOALS: (STG required if POC>30 days) Target Date: 01/30/22  Pt will obtain protective or mobilizing custom orthotic to help manage stiffness or protect painful joints, etc.  Goal status: INITIAL, PRN  2.  Pt will demo/state understanding of initial HEP to improve pain levels and prerequisite motion. Goal status: INITIAL   LONG TERM GOALS: Target Date: 02/27/22  Pt will improve functional ability by decreased impairment per Quick DASH assessment from 34% to 15% or better, for better quality of life. Goal status: INITIAL  2.  Pt will improve grip strength in Lt hand from 31lbs to at least 40lbs for functional use at home and in IADLs. Goal status: INITIAL  3.  Pt will improve A/ROM in Lt RF total active motion (TAM) from 167* to at least 200*, to have functional motion for tasks like reach and grasp.  Goal status: INITIAL  4.  Pt will improve strength in Rt hand and wrist to at least 4+/5 MMT to have increased functional ability to carry out selfcare and higher-level homecare tasks with no difficulty. Goal status: INITIAL  5.  Pt will improve coordination skills in Rt dom hand, as seen by better score on 9HPT testing to 20sec or better, to have increased functional ability to carry out fine motor tasks (fasteners, etc.) and more complex, coordinated IADLs (meal prep, sports, etc.).  Goal status: INITIAL    ASSESSMENT:  CLINICAL IMPRESSION: 01/19/22: She is learning management of several different issues and needs more time, practice.   Eval: Patient is a 76 y.o. 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 These things limit her function, and she will benefit from OP OT to improve function.    PLAN: OT FREQUENCY: 2x/week  OT DURATION: 6 weeks  PLANNED INTERVENTIONS: self care/ADL training, therapeutic exercise, therapeutic activity, neuromuscular re-education, manual therapy, passive range of motion, splinting, electrical stimulation, ultrasound, fluidotherapy, compression bandaging, moist heat, cryotherapy, contrast bath, patient/family education, energy conservation, coping strategies training, and DME and/or AE instructions  RECOMMENDED OTHER SERVICES: none now   CONSULTED AND AGREED WITH PLAN OF CARE: Patient  PLAN FOR NEXT SESSION:  Go over all stretches, add light stability strength for Lt thumb.  Consider Lt thumb orthotic if still painful.  Do manual therapy to help loosen/stretch as needed. RMO if needed for Lt RF.    Benito Mccreedy, OTR/L, CHT 01/19/2022, 12:13 PM

## 2022-01-21 NOTE — Therapy (Incomplete)
OUTPATIENT OCCUPATIONAL THERAPY TREATMENT NOTE  Patient Name: Sherri Smith MRN: [id number] DOB:[birth year], 76 y.o., female Today's Date: 01/21/2022  PCP: [name] PA REFERRING PROVIDER: Gregor Hams, MD      Past Medical History:  Diagnosis Date   Hyperlipidemia    Past Surgical History:  Procedure Laterality Date   APPENDECTOMY     OVARIAN CYST REMOVAL     1972   Patient Active Problem List   Diagnosis Date Noted   Carpal tunnel syndrome, right 01/08/2022   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   Anxiety 06/28/2020   Depression, recurrent ([place]) 06/28/2020   OSA (obstructive sleep apnea) 06/28/2020   Cataracts, bilateral 06/28/2020    ONSET DATE: chronic (1+ year)  REFERRING DIAG: M79.641,M79.642 (ICD-10-CM) - Bilateral hand pain  THERAPY DIAG:  No diagnosis found.  Rationale for Evaluation and Treatment Rehabilitation  PERTINENT HISTORY: Per MD: "Occupational Therapy Evaluate and Treat for bilat hand pain. 1-2 times per week for 4-6 weeks.  Decrease pain, increase strength, flexibility, function, and range of motion. Modalities may include, traction, ionto, phono, stim, and dry needling prn." From eval: "She states Lt hand base of thumb joint is constantly painful and RF is stiff for unknown reasons (but was tender, swollen in past).  Rt hand has been "excruciating" pain that started with tingling, numbness, stiffness with "shooting" nerve pain in IF and MF.  These things have gotten worse over the past 6 months. Her rt hand also wakes her up in the night with pain (throbbing, aching, not sharp/shooting). 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  More recently, she had had such pain that she states she could not even hold toilet paper. She states "teaching herself to be left handed." She had cortisone injection 01/08/22 from Dr. Georgina Snell, which  has made a "huge" difference, but she still ha tingling and stiffness. She has pre-fab wrist cock-up brace from years ago, the she she recently started wearing again at night. "  PRECAUTIONS: She states mild-moderate allergy to adhesive on skin   WEIGHT BEARING RESTRICTIONS No  SUBJECTIVE:   SUBJECTIVE STATEMENT: She states ***   been trying to watch nerve compression, wearing pre-fab brace at night, etc. Has been stretching RF and that is not painful.   PAIN:  Are you having pain? Yes *** Rating: 2-3/10 in Lt thumb now    OBJECTIVE: (All objective assessments below are from initial evaluation on: 01/13/22 unless otherwise specified.)    HAND DOMINANCE: Right   ADLs: Overall ADLs: States decreased ability to grab, hold household objects, pain and inability to open containers, perform FMS tasks (manipulate fasteners on clothing)   FUNCTIONAL OUTCOME MEASURES: Eval: Quck DASH 34% impairment today  (Higher % Score  =  More Impairment)     UPPER EXTREMITY ROM     Shoulder to Wrist AROM Right eval Left eval  Wrist flexion 65 61  Wrist extension 62 68  (Blank rows = not tested)   Hand AROM Right eval Left eval Lt 01/19/22  Full Fist Ability (or Gap to Distal Palmar Crease) Yes but loose fist due to tightness No, Lt RF has 4.8cm gap to [facility] with fist.    Thumb Opposition to Small Finger (or Gap) full full   Thumb Opposition to Base of Small Finger (or Gap)  full full   Thumb MCP (0-60)  Thumb IP (0-80)     Thumb Radial abd/add (0-55)      Thumb Palmar abd/add (0-45)      Ring MCP (0-90)   0 -  78 0-78  Ring PIP (0-100)    0-62 0-64  Ring DIP (0-70)   0-27  0-29  (Blank rows = not tested)   UPPER EXTREMITY MMT:    Eval:  NT at eval due to recent high pain and history of pain.  Will approach resistance when appropriate, pain-wise.   MMT Right TBD Left TBD  Elbow flexion    Elbow extension    Forearm supination    Forearm pronation    Wrist flexion     Wrist extension    Wrist ulnar deviation    Wrist radial deviation    (Blank rows = not tested)  HAND FUNCTION: Eval: Observed weakness in affected hand.  Grip strength Right: 51 lbs, Left: 31 lbs   COORDINATION: Eval: Observed coordination impairments with affected hand. 9 Hole Peg Test Right: 23sec (diminished), Left: sec 21.6sec (WFL)  SENSATION: Eval: static 2PD: Lt hand has solid 56m discrimination sense in ulnar and median nerves, Rt hand has  between 4-52msense in median nerve and 33m72mn ulnar nerve. Some c/o hypersensitivity in Rt hand with testing.   EDEMA:   Eval: Lt Rf PIP J slight swollen   OBSERVATIONS:   Eval: some CMC J zig-zag collapse Lt > Rt noted (from thumb OA), Lt RF PIP J is somewhat passively flexible and can tolerate stretch. Right hand history sounds like nerve pain and diffuse hand pain (possibly from overuse/OA) leading to disuse and stiffness.     OT impression:  4 main issues:  1- Lt thumb CMC J OA,  2- Lt RF PIP joint effusion with mild contracture,  3- Rt hand median nerve pain/hypersensitivity  4- stiffness of Rt hand due to disuse and likely OA   (also small concern for Rt hand CRPS- though not significantly swollen)   TODAY'S TREATMENT:  01/22/22: ***  01/19/22: RF is a bit looser than at evaluation. She reviews hook and composite stretches.  OT also edu more on nerve management, teaches nerve glides in 2 ways for Rt median Nerve and she tolerates these very well.  To help manage  Lt thumb pain OT also edu on 2 stretches at the wrist and 2 [facility](as [facility] and these are relieving to her. (New exercises are bolded).   Exercises - Median Nerve Flossing  - 4-6 x daily - 1 sets - 10-15 reps - Seated Median Nerve Glide  - 3-4 x daily - 5 reps - Wrist Flexion Stretch  - 4 x daily - 3-5 reps - 15 sec hold - Wrist Prayer Stretch  - 4 x daily - 3-5 reps - 15 sec hold - Tendon Glides  - 4-6 x daily - 3-5 reps - 2-3 seconds hold - HOOK  Stretch  - 4 x daily - 3-5 reps - 15-20 sec hold - Seated Finger Composite Flexion Stretch  - 4 x daily - 3-5 reps - 15 hold - Stretch thumb downward   - 2-3 x daily - 3-5 reps - 15 sec hold - Stretch Thumb into "C-Shape" (don't use other thumb)  - 2-3 x daily - 3-5 reps - 15 sec hold   Post-evaluation treatment:  To help manage self-care and symptoms, we discussed habits in relation to avoiding bent wrist, or repetitive motion or prolonged  postures (helps manage nerves and OA). OT also provides neuro education about median neuropathy/compression, avoiding compression at neck, elbow, and wrist as able. We discussed sleep positions and OT gives recommendations for pre-fab orthotic wear. We also discussed first two HEP exercises to help improve Lt RF motion (as below). She does well with OT supervision, has no pain increase.  She is also given buddy strap to help encourage LT RF motion as well.   Exercises - HOOK Stretch  - 4 x daily - 3-5 reps - 15-20 sec hold - Seated Finger Composite Flexion Stretch  - 4 x daily - 3-5 reps - 15 hold   PATIENT EDUCATION: Education details: See tx section above for details  Person educated: Patient Education method: Verbal Instruction, Teach back, Handouts  Education comprehension: States and demonstrates understanding, Additional Education required    HOME EXERCISE PROGRAM: Access Code: [email or web address] URL: [email or web address] Date: 01/13/2022 Prepared by: Benito Mccreedy   GOALS: Goals reviewed with patient? Yes   SHORT TERM GOALS: (STG required if POC>30 days) Target Date: 01/30/22  Pt will obtain protective or mobilizing custom orthotic to help manage stiffness or protect painful joints, etc.  Goal status: INITIAL, PRN  2.  Pt will demo/state understanding of initial HEP to improve pain levels and prerequisite motion. Goal status: INITIAL   LONG TERM GOALS: Target Date: 02/27/22  Pt will improve functional ability by decreased  impairment per Quick DASH assessment from 34% to 15% or better, for better quality of life. Goal status: INITIAL  2.  Pt will improve grip strength in Lt hand from 31lbs to at least 40lbs for functional use at home and in IADLs. Goal status: INITIAL  3.  Pt will improve A/ROM in Lt RF total active motion (TAM) from 167* to at least 200*, to have functional motion for tasks like reach and grasp.  Goal status: INITIAL  4.  Pt will improve strength in Rt hand and wrist to at least 4+/5 MMT to have increased functional ability to carry out selfcare and higher-level homecare tasks with no difficulty. Goal status: INITIAL  5.  Pt will improve coordination skills in Rt dom hand, as seen by better score on 9HPT testing to 20sec or better, to have increased functional ability to carry out fine motor tasks (fasteners, etc.) and more complex, coordinated IADLs (meal prep, sports, etc.).  Goal status: INITIAL    ASSESSMENT:  CLINICAL IMPRESSION: 01/22/22: ***  01/19/22: She is learning management of several different issues and needs more time, practice.   Eval: Patient is a 76 y.o. 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 These things limit her function, and she will benefit from OP OT to improve function.    PLAN: OT FREQUENCY: 2x/week  OT DURATION: 6 weeks  PLANNED INTERVENTIONS: self care/ADL training, therapeutic exercise, therapeutic activity, neuromuscular re-education, manual therapy, passive range of motion, splinting, electrical stimulation, ultrasound, fluidotherapy, compression bandaging, moist heat, cryotherapy, contrast bath, patient/family education, energy conservation, coping strategies training, and DME and/or AE instructions  RECOMMENDED OTHER SERVICES: none now   CONSULTED AND AGREED WITH PLAN OF CARE: Patient  PLAN FOR NEXT SESSION:   ***  Go over all stretches, add light stability strength for Lt thumb.  Consider Lt thumb orthotic if still painful.  Do manual therapy to help loosen/stretch as needed. RMO if needed for Lt RF.  Benito Mccreedy, OTR/L, CHT 01/21/2022, 4:56 PM

## 2022-01-22 ENCOUNTER — Encounter: Payer: Medicare HMO | Admitting: Rehabilitative and Restorative Service Providers"

## 2022-01-27 ENCOUNTER — Ambulatory Visit: Payer: Medicare HMO | Admitting: Rehabilitative and Restorative Service Providers"

## 2022-01-27 ENCOUNTER — Encounter: Payer: Self-pay | Admitting: Rehabilitative and Restorative Service Providers"

## 2022-01-27 DIAGNOSIS — R278 Other lack of coordination: Secondary | ICD-10-CM | POA: Diagnosis not present

## 2022-01-27 DIAGNOSIS — M25641 Stiffness of right hand, not elsewhere classified: Secondary | ICD-10-CM | POA: Diagnosis not present

## 2022-01-27 DIAGNOSIS — R600 Localized edema: Secondary | ICD-10-CM | POA: Diagnosis not present

## 2022-01-27 DIAGNOSIS — R202 Paresthesia of skin: Secondary | ICD-10-CM

## 2022-01-27 DIAGNOSIS — M25531 Pain in right wrist: Secondary | ICD-10-CM

## 2022-01-27 DIAGNOSIS — M6281 Muscle weakness (generalized): Secondary | ICD-10-CM

## 2022-01-27 DIAGNOSIS — M25642 Stiffness of left hand, not elsewhere classified: Secondary | ICD-10-CM

## 2022-01-27 DIAGNOSIS — M79641 Pain in right hand: Secondary | ICD-10-CM

## 2022-01-27 DIAGNOSIS — M25532 Pain in left wrist: Secondary | ICD-10-CM | POA: Diagnosis not present

## 2022-01-27 DIAGNOSIS — M25542 Pain in joints of left hand: Secondary | ICD-10-CM

## 2022-01-27 NOTE — Therapy (Signed)
OUTPATIENT OCCUPATIONAL THERAPY TREATMENT NOTE  Patient Name: Sherri Smith MRN: [id number] DOB:[birth year], 76 y.o., female Today's Date: 01/27/2022  PCP: [name] PA REFERRING PROVIDER: Gregor Hams, MD   OT End of Session - 01/27/22 1355     Visit Number 3    Number of Visits 12    Date for OT Re-Evaluation 02/27/22    Authorization Type [employer]    Progress Note Due on Visit 10    OT Start Time 1355    OT Stop Time 1431    OT Time Calculation (min) 36 min    Activity Tolerance Patient tolerated treatment well;No increased pain;Patient limited by fatigue;Patient limited by pain    Behavior During Therapy [facility] for tasks assessed/performed               Past Medical History:  Diagnosis Date   Hyperlipidemia    Past Surgical History:  Procedure Laterality Date   APPENDECTOMY     OVARIAN CYST REMOVAL     1972   Patient Active Problem List   Diagnosis Date Noted   Carpal tunnel syndrome, right 01/08/2022   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   Anxiety 06/28/2020   Depression, recurrent ([place]) 06/28/2020   OSA (obstructive sleep apnea) 06/28/2020   Cataracts, bilateral 06/28/2020    ONSET DATE: chronic (1+ year)  REFERRING DIAG: M79.641,M79.642 (ICD-10-CM) - Bilateral hand pain  THERAPY DIAG:  Muscle weakness (generalized)  Pain in left wrist  Pain in joint of left hand  Stiffness of left hand, not elsewhere classified  Pain in right hand  Pain in right wrist  Stiffness of right hand, not elsewhere classified  Other lack of coordination  Localized edema  Paresthesia of skin  Rationale for Evaluation and Treatment Rehabilitation  PERTINENT HISTORY: Per MD: "Occupational Therapy Evaluate and Treat for bilat hand pain. 1-2 times per week for 4-6 weeks.  Decrease pain, increase strength, flexibility, function, and range of motion. Modalities may include, traction, ionto, phono, stim, and dry needling prn." From eval: "She states Lt hand  base of thumb joint is constantly painful and RF is stiff for unknown reasons (but was tender, swollen in past).  Rt hand has been "excruciating" pain that started with tingling, numbness, stiffness with "shooting" nerve pain in IF and MF.  These things have gotten worse over the past 6 months. Her rt hand also wakes her up in the night with pain (throbbing, aching, not sharp/shooting). 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  More recently, she had had such pain that she states she could not even hold toilet paper. She states "teaching herself to be left handed." She had cortisone injection 01/08/22 from Dr. Georgina Snell, which has made a "huge" difference, but she still ha tingling and stiffness. She has pre-fab wrist cock-up brace from years ago, the she she recently started wearing again at night. "  PRECAUTIONS: She states mild-moderate allergy to adhesive on skin   WEIGHT BEARING RESTRICTIONS No  SUBJECTIVE:   SUBJECTIVE STATEMENT: She states exercises have helped thumb which is much less painful now. She is happy and hopeful.    PAIN:  Are you having pain? Not at rest Rating: 0/10 in Lt thumb now   OBJECTIVE: (All objective assessments below are from initial evaluation on: 01/13/22 unless otherwise specified.)    HAND DOMINANCE: Right   ADLs:  Overall ADLs: States decreased ability to grab, hold household objects, pain and inability to open containers, perform FMS tasks (manipulate fasteners on clothing)   FUNCTIONAL OUTCOME MEASURES: Eval: Quck DASH 34% impairment today  (Higher % Score  =  More Impairment)     UPPER EXTREMITY ROM     Shoulder to Wrist AROM Right eval Left eval  Wrist flexion 65 61  Wrist extension 62 68  (Blank rows = not tested)   Hand AROM Right eval Left eval Lt 01/19/22 Lt 01/27/22  Full Fist Ability (or Gap to Distal  Palmar Crease) Yes but loose fist due to tightness No, Lt RF has 4.8cm gap to [facility] with fist.   4.0cm gap today RF to [facility]  Thumb Opposition to Small Finger (or Gap) full full    Thumb Opposition to Base of Small Finger (or Gap)  full full    Thumb MCP (0-60)      Thumb IP (0-80)      Thumb Radial abd/add (0-55)       Thumb Palmar abd/add (0-45)       Ring MCP (0-90)   0 -  78 0-78 0- 76  Ring PIP (0-100)    0-62 0-64 0- 67  Ring DIP (0-70)   0-27  0-29 0- 44  (Blank rows = not tested)   UPPER EXTREMITY MMT:    Eval:  NT at eval due to recent high pain and history of pain.  Will approach resistance when appropriate, pain-wise.   MMT Right TBD Left TBD  Elbow flexion    Elbow extension    Forearm supination    Forearm pronation    Wrist flexion    Wrist extension    Wrist ulnar deviation    Wrist radial deviation    (Blank rows = not tested)  HAND FUNCTION: Eval: Observed weakness in affected hand.  Grip strength Right: 51 lbs, Left: 31 lbs   COORDINATION: Eval: Observed coordination impairments with affected hand. 9 Hole Peg Test Right: 23sec (diminished), Left: sec 21.6sec (WFL)  SENSATION: Eval: static 2PD: Lt hand has solid 522m discrimination sense in ulnar and median nerves, Rt hand has  between 4-554msense in median nerve and 22m41mn ulnar nerve. Some c/o hypersensitivity in Rt hand with testing.   EDEMA:   Eval: Lt Rf PIP J slight swollen   OBSERVATIONS:   Eval: some CMC J zig-zag collapse Lt > Rt noted (from thumb OA), Lt RF PIP J is somewhat passively flexible and can tolerate stretch. Right hand history sounds like nerve pain and diffuse hand pain (possibly from overuse/OA) leading to disuse and stiffness.     OT impression:  4 main issues:  1- Lt thumb CMC J OA,  2- Lt RF PIP joint effusion with mild contracture,  3- Rt hand median nerve pain/hypersensitivity  4- stiffness of Rt hand due to disuse and likely OA   (also small concern for Rt hand CRPS- though not  significantly swollen)   TODAY'S TREATMENT:  01/27/22: She does AROM for new measures which continue to show improvement at RF. She reviews stretches for thumb and RF, and OT adds edu for new light strengthening for thumb OA management (the below exercises in bold). She tolerates these well, told to do 2-3 x day (less often than stretches) and non-painfully or repetitively.   Exercises - Median Nerve Flossing  - 4-6 x daily - 1 sets - 10-15 reps - Seated Median Nerve Glide  -  3-4 x daily - 5 reps - Wrist Flexion Stretch  - 4 x daily - 3-5 reps - 15 sec hold - Wrist Prayer Stretch  - 4 x daily - 3-5 reps - 15 sec hold - Tendon Glides  - 4-6 x daily - 3-5 reps - 2-3 seconds hold - HOOK Stretch  - 4 x daily - 3-5 reps - 15-20 sec hold - Seated Finger Composite Flexion Stretch  - 4 x daily - 3-5 reps - 15 hold - Stretch thumb downward   - 2-3 x daily - 3-5 reps - 15 sec hold - Stretch Thumb into "C-Shape" (don't use other thumb)  - 2-3 x daily - 3-5 reps - 15 sec hold - Towel Roll Grip with Forearm in Neutral  - 2-3 x daily - 3-5 reps - 10 sec hold - Resisted Finger Abduction - Index and Middle  - 2-3 x daily - 5-10 reps - 2-3 sec hold - C-Strength (try using rubber band)   - 2-3 x daily - 5-10 reps - 2-3 sec hold   01/19/22: RF is a bit looser than at evaluation. She reviews hook and composite stretches.  OT also edu more on nerve management, teaches nerve glides in 2 ways for Rt median Nerve and she tolerates these very well.  To help manage  Lt thumb pain OT also edu on 2 stretches at the wrist and 2 stretches at thumb [facility] J (as below) and these are relieving to her. (New exercises are bolded).   Exercises - Median Nerve Flossing  - 4-6 x daily - 1 sets - 10-15 reps - Seated Median Nerve Glide  - 3-4 x daily - 5 reps - Wrist Flexion Stretch  - 4 x daily - 3-5 reps - 15 sec hold - Wrist Prayer Stretch  - 4 x daily - 3-5 reps - 15 sec hold - Tendon Glides  - 4-6 x daily - 3-5 reps - 2-3  seconds hold - HOOK Stretch  - 4 x daily - 3-5 reps - 15-20 sec hold - Seated Finger Composite Flexion Stretch  - 4 x daily - 3-5 reps - 15 hold - Stretch thumb downward   - 2-3 x daily - 3-5 reps - 15 sec hold - Stretch Thumb into "C-Shape" (don't use other thumb)  - 2-3 x daily - 3-5 reps - 15 sec hold   Post-evaluation treatment:  To help manage self-care and symptoms, we discussed habits in relation to avoiding bent wrist, or repetitive motion or prolonged postures (helps manage nerves and OA). OT also provides neuro education about median neuropathy/compression, avoiding compression at neck, elbow, and wrist as able. We discussed sleep positions and OT gives recommendations for pre-fab orthotic wear. We also discussed first two HEP exercises to help improve Lt RF motion (as below). She does well with OT supervision, has no pain increase.  She is also given buddy strap to help encourage LT RF motion as well.   Exercises - HOOK Stretch  - 4 x daily - 3-5 reps - 15-20 sec hold - Seated Finger Composite Flexion Stretch  - 4 x daily - 3-5 reps - 15 hold   PATIENT EDUCATION: Education details: See tx section above for details  Person educated: Patient Education method: Verbal Instruction, Teach back, Handouts  Education comprehension: States and demonstrates understanding, Additional Education required    HOME EXERCISE PROGRAM: Access Code: [email or web address] URL: [email or web address] Date: 01/13/2022 Prepared by: Benito Mccreedy   GOALS: Goals reviewed with patient?  Yes   SHORT TERM GOALS: (STG required if POC>30 days) Target Date: 01/30/22  Pt will obtain protective or mobilizing custom orthotic to help manage stiffness or protect painful joints, etc.  Goal status: INITIAL, PRN  2.  Pt will demo/state understanding of initial HEP to improve pain levels and prerequisite motion. Goal status: MET   LONG TERM GOALS: Target Date: 02/27/22  Pt will improve functional  ability by decreased impairment per Quick DASH assessment from 34% to 15% or better, for better quality of life. Goal status: INITIAL  2.  Pt will improve grip strength in Lt hand from 31lbs to at least 40lbs for functional use at home and in IADLs. Goal status: INITIAL  3.  Pt will improve A/ROM in Lt RF total active motion (TAM) from 167* to at least 200*, to have functional motion for tasks like reach and grasp.  Goal status: INITIAL  4.  Pt will improve strength in Rt hand and wrist to at least 4+/5 MMT to have increased functional ability to carry out selfcare and higher-level homecare tasks with no difficulty. Goal status: INITIAL  5.  Pt will improve coordination skills in Rt dom hand, as seen by better score on 9HPT testing to 20sec or better, to have increased functional ability to carry out fine motor tasks (fasteners, etc.) and more complex, coordinated IADLs (meal prep, sports, etc.).  Goal status: INITIAL    ASSESSMENT:  CLINICAL IMPRESSION: 01/27/22: She has no numbness now, she has little to no pain at thumb now. She has improving RF AROM now.  It's all going well, and we will continue POC.    01/19/22: She is learning management of several different issues and needs more time, practice.   Eval: Patient is a 76 y.o. 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 These things limit her function, and she will benefit from OP OT to improve function.    PLAN: OT FREQUENCY: 2x/week  OT DURATION: 6 weeks  PLANNED INTERVENTIONS: self care/ADL training, therapeutic exercise, therapeutic activity, neuromuscular re-education, manual therapy, passive range of motion, splinting, electrical stimulation, ultrasound, fluidotherapy, compression bandaging, moist heat, cryotherapy, contrast bath, patient/family education, energy conservation, coping strategies  training, and DME and/or AE instructions  RECOMMENDED OTHER SERVICES: none now   CONSULTED AND AGREED WITH PLAN OF CARE: Patient  PLAN FOR NEXT SESSION:  Check HEP and new strength, take new measures. Consider dropping to 1xweek, if all going well. Manual therapy as needed.  Consider Lt thumb orthotic if still painful.  Do manual therapy to help loosen/stretch as needed. RMO if needed for Lt RF.    Benito Mccreedy, OTR/L, CHT 01/27/2022, 5:29 PM

## 2022-01-28 NOTE — Therapy (Signed)
OUTPATIENT OCCUPATIONAL THERAPY TREATMENT NOTE  Patient Name: Sherri Smith MRN: [id number] DOB:1945-05-23, 76 y.o., female Today's Date: 01/29/2022  PCP: [name] PA REFERRING PROVIDER: Gregor Hams, MD   OT End of Session - 01/29/22 1019     Visit Number 4    Number of Visits 12    Date for OT Re-Evaluation 02/27/22    Authorization Type [employer]    Progress Note Due on Visit 10    OT Start Time 1019    OT Stop Time 1053    OT Time Calculation (min) 34 min    Activity Tolerance Patient tolerated treatment well;No increased pain;Patient limited by fatigue;Patient limited by pain    Behavior During Therapy [facility] for tasks assessed/performed                Past Medical History:  Diagnosis Date   Hyperlipidemia    Past Surgical History:  Procedure Laterality Date   APPENDECTOMY     OVARIAN CYST REMOVAL     1972   Patient Active Problem List   Diagnosis Date Noted   Carpal tunnel syndrome, right 01/08/2022   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   Anxiety 06/28/2020   Depression, recurrent ([place]) 06/28/2020   OSA (obstructive sleep apnea) 06/28/2020   Cataracts, bilateral 06/28/2020    ONSET DATE: chronic (1+ year)  REFERRING DIAG: M79.641,M79.642 (ICD-10-CM) - Bilateral hand pain  THERAPY DIAG:  Stiffness of left hand, not elsewhere classified  Pain in joint of left hand  Pain in right wrist  Pain in right hand  Muscle weakness (generalized)  Pain in left wrist  Stiffness of right hand, not elsewhere classified  Paresthesia of skin  Localized edema  Other lack of coordination  Rationale for Evaluation and Treatment Rehabilitation  PERTINENT HISTORY: Per MD: "Occupational Therapy Evaluate and Treat for bilat hand pain. 1-2 times per week for 4-6 weeks.  Decrease pain, increase strength, flexibility, function, and range of motion. Modalities may include, traction, ionto, phono, stim, and dry needling prn." From eval: "She states Lt hand  base of thumb joint is constantly painful and RF is stiff for unknown reasons (but was tender, swollen in past).  Rt hand has been "excruciating" pain that started with tingling, numbness, stiffness with "shooting" nerve pain in IF and MF.  These things have gotten worse over the past 6 months. Her rt hand also wakes her up in the night with pain (throbbing, aching, not sharp/shooting). 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  More recently, she had had such pain that she states she could not even hold toilet paper. She states "teaching herself to be left handed." She had cortisone injection 01/08/22 from Dr. Georgina Snell, which has made a "huge" difference, but she still ha tingling and stiffness. She has pre-fab wrist cock-up brace from years ago, the she she recently started wearing again at night. "  PRECAUTIONS: She states mild-moderate allergy to adhesive on skin   WEIGHT BEARING RESTRICTIONS No   SUBJECTIVE:   SUBJECTIVE STATEMENT: She states new strength is fine and hasn't hurt, she feels great.    PAIN:  Are you having pain? Not at rest  Rating: 0/10 in Lt thumb now   OBJECTIVE: (All objective assessments below are from initial evaluation on: 01/13/22 unless otherwise specified.)    HAND DOMINANCE: Right   ADLs: Overall ADLs:  States decreased ability to grab, hold household objects, pain and inability to open containers, perform FMS tasks (manipulate fasteners on clothing)   FUNCTIONAL OUTCOME MEASURES: Eval: Quck DASH 34% impairment today  (Higher % Score  =  More Impairment)     UPPER EXTREMITY ROM     Shoulder to Wrist AROM Right eval Left eval  Wrist flexion 65 61  Wrist extension 62 68  (Blank rows = not tested)   Hand AROM Right eval Left eval Lt 01/19/22 Lt 01/27/22 Lt 01/29/22  Full Fist Ability (or Gap to Distal Palmar Crease) Yes  but loose fist due to tightness No, Lt RF has 4.8cm gap to [facility] with fist.   4.0cm gap today RF to [facility]   Thumb Opposition to Small Finger (or Gap) full full     Thumb Opposition to [employer] Finger (or Gap)  full full     Thumb MCP (0-60)       Thumb IP (0-80)       Thumb Radial abd/add (0-55)        Thumb Palmar abd/add (0-45)        Ring MCP (0-90)   0 -  78 0-78 0- 76 0 - 82  Ring PIP (0-100)    0-62 0-64 0- 67 0 - 64  Ring DIP (0-70)   0-27  0-29 0- 44 0 - 41  (Blank rows = not tested)   UPPER EXTREMITY MMT:    Eval:  NT at eval due to recent high pain and history of pain.  Will approach resistance when appropriate, pain-wise.   MMT Right TBD Left TBD  Elbow flexion    Elbow extension    Forearm supination    Forearm pronation    Wrist flexion    Wrist extension    Wrist ulnar deviation    Wrist radial deviation    (Blank rows = not tested)  HAND FUNCTION: Eval: Observed weakness in affected hand.  Grip strength Right: 51 lbs, Left: 31 lbs   COORDINATION: 01/29/22: Lt 23sec today, no pain  Eval: Observed coordination impairments with affected hand. 9 Hole Peg Test Right: 23sec (diminished), Left: sec 21.6sec (WFL)  SENSATION: Eval: static 2PD: Lt hand has solid 548m discrimination sense in ulnar and median nerves, Rt hand has  between 4-569msense in median nerve and 48m66mn ulnar nerve. Some c/o hypersensitivity in Rt hand with testing.   EDEMA:   Eval: Lt Rf PIP J slight swollen   OBSERVATIONS:   Eval: some CMC J zig-zag collapse Lt > Rt noted (from thumb OA), Lt RF PIP J is somewhat passively flexible and can tolerate stretch. Right hand history sounds like nerve pain and diffuse hand pain (possibly from overuse/OA) leading to disuse and stiffness.     OT impression:  4 main issues:  1- Lt thumb CMC J OA,  2- Lt RF PIP joint effusion with mild contracture,  3- Rt hand median nerve pain/hypersensitivity  4- stiffness of Rt hand due to disuse and likely OA   (also  small concern for Rt hand CRPS- though not significantly swollen)   TODAY'S TREATMENT:  01/29/22:  She does AROM for new measures, which are not significantly better in RF, so OT makes RMO to increase tension to IP Js with daily activities and to be used for blocking exercises. She states it fits well and she can feel the increased force to her IP Js and no pain. OT then does manual  IASTM for MFR to Lt thumb followed by thumb stretches and isometric strength with review of new strength. She tolerates all well and feels great at end of session.    01/27/22: She does AROM for new measures which continue to show improvement at RF. She reviews stretches for thumb and RF, and OT adds edu for new light strengthening for thumb OA management (the below exercises in bold). She tolerates these well, told to do 2-3 x day (less often than stretches) and non-painfully or repetitively.   Exercises - Median Nerve Flossing  - 4-6 x daily - 1 sets - 10-15 reps - Seated Median Nerve Glide  - 3-4 x daily - 5 reps - Wrist Flexion Stretch  - 4 x daily - 3-5 reps - 15 sec hold - Wrist Prayer Stretch  - 4 x daily - 3-5 reps - 15 sec hold - Tendon Glides  - 4-6 x daily - 3-5 reps - 2-3 seconds hold - HOOK Stretch  - 4 x daily - 3-5 reps - 15-20 sec hold - Seated Finger Composite Flexion Stretch  - 4 x daily - 3-5 reps - 15 hold - Stretch thumb downward   - 2-3 x daily - 3-5 reps - 15 sec hold - Stretch Thumb into "C-Shape" (don't use other thumb)  - 2-3 x daily - 3-5 reps - 15 sec hold - Towel Roll Grip with Forearm in Neutral  - 2-3 x daily - 3-5 reps - 10 sec hold - Resisted Finger Abduction - Index and Middle  - 2-3 x daily - 5-10 reps - 2-3 sec hold - C-Strength (try using rubber band)   - 2-3 x daily - 5-10 reps - 2-3 sec hold   PATIENT EDUCATION: Education details: See tx section above for details  Person educated: Patient Education method: Verbal Instruction, Teach back, Handouts  Education comprehension:  States and demonstrates understanding, Additional Education required    HOME EXERCISE PROGRAM: Access Code: [email or web address] URL: [email or web address] Date: 01/13/2022 Prepared by: Benito Mccreedy   GOALS: Goals reviewed with patient? Yes   SHORT TERM GOALS: (STG required if POC>30 days) Target Date: 01/30/22  Pt will obtain protective or mobilizing custom orthotic to help manage stiffness or protect painful joints, etc.  Goal status: INITIAL, PRN  2.  Pt will demo/state understanding of initial HEP to improve pain levels and prerequisite motion. Goal status: MET   LONG TERM GOALS: Target Date: 02/27/22  Pt will improve functional ability by decreased impairment per Quick DASH assessment from 34% to 15% or better, for better quality of life. Goal status: INITIAL  2.  Pt will improve grip strength in Lt hand from 31lbs to at least 40lbs for functional use at home and in IADLs. Goal status: INITIAL  3.  Pt will improve A/ROM in Lt RF total active motion (TAM) from 167* to at least 200*, to have functional motion for tasks like reach and grasp.  Goal status: INITIAL  4.  Pt will improve strength in Rt hand and wrist to at least 4+/5 MMT to have increased functional ability to carry out selfcare and higher-level homecare tasks with no difficulty. Goal status: INITIAL  5.  Pt will improve coordination skills in Rt dom hand, as seen by better score on 9HPT testing to 20sec or better, to have increased functional ability to carry out fine motor tasks (fasteners, etc.) and more complex, coordinated IADLs (meal prep, sports, etc.).  Goal status: INITIAL    ASSESSMENT:  CLINICAL IMPRESSION:  01/29/22:  She is doing very well, so we will drop to 1x week, so she can do more self-management and to allow for healing time.    01/27/22: She has no numbness now, she has little to no pain at thumb now. She has improving RF AROM now.  It's all going well, and we will continue POC.    Eval: Patient is a 76 y.o. 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 These things limit her function, and she will benefit from OP OT to improve function.    PLAN: OT FREQUENCY: 2x/week  OT DURATION: 6 weeks  PLANNED INTERVENTIONS: self care/ADL training, therapeutic exercise, therapeutic activity, neuromuscular re-education, manual therapy, passive range of motion, splinting, electrical stimulation, ultrasound, fluidotherapy, compression bandaging, moist heat, cryotherapy, contrast bath, patient/family education, energy conservation, coping strategies training, and DME and/or AE instructions  RECOMMENDED OTHER SERVICES: none now   CONSULTED AND AGREED WITH PLAN OF CARE: Patient  PLAN FOR NEXT SESSION:  We will reassess next week for possible D/C , check all goals, etc.    Benito Mccreedy, OTR/L, CHT 01/29/2022, 10:59 AM

## 2022-01-29 ENCOUNTER — Ambulatory Visit: Payer: Medicare HMO | Admitting: Rehabilitative and Restorative Service Providers"

## 2022-01-29 ENCOUNTER — Encounter: Payer: Self-pay | Admitting: Rehabilitative and Restorative Service Providers"

## 2022-01-29 DIAGNOSIS — M25532 Pain in left wrist: Secondary | ICD-10-CM | POA: Diagnosis not present

## 2022-01-29 DIAGNOSIS — M25641 Stiffness of right hand, not elsewhere classified: Secondary | ICD-10-CM

## 2022-01-29 DIAGNOSIS — R202 Paresthesia of skin: Secondary | ICD-10-CM | POA: Diagnosis not present

## 2022-01-29 DIAGNOSIS — R600 Localized edema: Secondary | ICD-10-CM

## 2022-01-29 DIAGNOSIS — M25642 Stiffness of left hand, not elsewhere classified: Secondary | ICD-10-CM

## 2022-01-29 DIAGNOSIS — M6281 Muscle weakness (generalized): Secondary | ICD-10-CM | POA: Diagnosis not present

## 2022-01-29 DIAGNOSIS — M25531 Pain in right wrist: Secondary | ICD-10-CM

## 2022-01-29 DIAGNOSIS — R278 Other lack of coordination: Secondary | ICD-10-CM

## 2022-01-29 DIAGNOSIS — M25542 Pain in joints of left hand: Secondary | ICD-10-CM

## 2022-01-29 DIAGNOSIS — M79641 Pain in right hand: Secondary | ICD-10-CM

## 2022-02-03 ENCOUNTER — Encounter: Payer: Medicare HMO | Admitting: Rehabilitative and Restorative Service Providers"

## 2022-02-04 NOTE — Therapy (Signed)
OUTPATIENT OCCUPATIONAL THERAPY TREATMENT & DISCHARGE NOTE  Patient Name: Sherri Smith MRN: [id number] DOB:10-28-1945, 76 y.o., female Today's Date: 02/05/2022  PCP: [name] PA REFERRING PROVIDER: Gregor Hams, MD   OT End of Session - 02/05/22 1428     Visit Number 5    Number of Visits 12    Date for OT Re-Evaluation 02/27/22    Authorization Type [employer]    Progress Note Due on Visit 10    OT Start Time 1430    OT Stop Time 1458    OT Time Calculation (min) 28 min    Activity Tolerance Patient tolerated treatment well;No increased pain    Behavior During Therapy WFL for tasks assessed/performed                 Past Medical History:  Diagnosis Date   Hyperlipidemia    Past Surgical History:  Procedure Laterality Date   APPENDECTOMY     OVARIAN CYST REMOVAL     1972   Patient Active Problem List   Diagnosis Date Noted   Carpal tunnel syndrome, right 01/08/2022   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   Anxiety 06/28/2020   Depression, recurrent ([place]) 06/28/2020   OSA (obstructive sleep apnea) 06/28/2020   Cataracts, bilateral 06/28/2020    ONSET DATE: chronic (1+ year)  REFERRING DIAG: M79.641,M79.642 (ICD-10-CM) - Bilateral hand pain  THERAPY DIAG:  Stiffness of left hand, not elsewhere classified  Pain in right hand  Pain in right wrist  Muscle weakness (generalized)  Pain in left wrist  Paresthesia of skin  Stiffness of right hand, not elsewhere classified  Localized edema  Other lack of coordination  Pain in joint of left hand  Rationale for Evaluation and Treatment Rehabilitation  PERTINENT HISTORY: Per MD: "Occupational Therapy Evaluate and Treat for bilat hand pain. 1-2 times per week for 4-6 weeks.  Decrease pain, increase strength, flexibility, function, and range of motion. Modalities may include, traction, ionto, phono, stim, and dry needling prn." From eval: "She states Lt hand base of thumb joint is constantly  painful and RF is stiff for unknown reasons (but was tender, swollen in past).  Rt hand has been "excruciating" pain that started with tingling, numbness, stiffness with "shooting" nerve pain in IF and MF.  These things have gotten worse over the past 6 months. Her rt hand also wakes her up in the night with pain (throbbing, aching, not sharp/shooting). 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  More recently, she had had such pain that she states she could not even hold toilet paper. She states "teaching herself to be left handed." She had cortisone injection 01/08/22 from Dr. Georgina Snell, which has made a "huge" difference, but she still ha tingling and stiffness. She has pre-fab wrist cock-up brace from years ago, the she she recently started wearing again at night. "  PRECAUTIONS: She states mild-moderate allergy to adhesive on skin   WEIGHT BEARING RESTRICTIONS No   SUBJECTIVE:   SUBJECTIVE STATEMENT: She states having the tools to manage her symptoms, thanks therapist.    PAIN:  Are you having pain? Not at rest  Rating: 0/10 in Lt thumb now   OBJECTIVE: (All objective assessments below are from initial evaluation on: 01/13/22 unless otherwise specified.)    HAND DOMINANCE: Right   ADLs: Overall ADLs: 02/05/22: She states minimal  to no problems now with B/IADLs    FUNCTIONAL OUTCOME MEASURES: 02/05/22: Quick DASH 13% Impairment today  Eval: Quck DASH 34% impairment today  (Higher % Score  =  More Impairment)     UPPER EXTREMITY ROM     Shoulder to Wrist AROM Right eval Left eval Rt  /  Lt  02/05/22  Wrist flexion 65 61 67  /  58  Wrist extension 62 68 64  /  64  (Blank rows = not tested)   Hand AROM Right eval Left eval Lt 01/19/22 Lt 01/27/22 Lt 01/29/22 Lt 02/05/22  Full Fist Ability (or Gap to Distal Palmar Crease) Yes but loose fist due to  tightness No, Lt RF has 4.8cm gap to [facility] with fist.   4.0cm gap today RF to [facility]  4.0 cm gap to [facility] today (from RF only)   Thumb Opposition to Small Finger (or Gap) full full    full  Thumb Opposition to [employer] Finger (or Gap)  full full    full  Thumb MCP (0-60)        Thumb IP (0-80)        Thumb Radial abd/add (0-55)         Thumb Palmar abd/add (0-45)         Ring MCP (0-90)   0 -  78 0-78 0- 76 0 - 82 0- 85  Ring PIP (0-100)    0-62 0-64 0- 67 0 - 64 (-8)-  72  Ring DIP (0-70)   0-27  0-29 0- 44 0 - 41 0- 48  (Blank rows = not tested)   UPPER EXTREMITY MMT:    Eval:  NT at eval due to recent high pain and history of pain.  Will approach resistance when appropriate, pain-wise.   MMT Right 02/05/22 Left 02/05/22  Wrist flexion 5/5 5/5  Wrist extension 5/5 5/5  Thumb Flexion 5/5 5/5  Thumb palmar abduction 5/5 4+/5  (Blank rows = not tested)  HAND FUNCTION: 02/05/22: Grip strength Right: 66 lbs, Left: 36 lbs (no pain)  Eval: Observed weakness in affected hand.  Grip strength Right: 51 lbs, Left: 31 lbs   COORDINATION: 02/05/22: 9HPT Rt: 20sec  01/29/22: Lt 23sec today, no pain  Eval: Observed coordination impairments with affected hand. 9 Hole Peg Test Right: 23sec (diminished), Left: sec 21.6sec (WFL)   TODAY'S TREATMENT:  02/05/22: Pt performs AROM, gripping, coordination activities and strength with b/l hands/thumbs/wrists against resistance for exercise/activities as well as new measures today. Using that data, OT also reviews home exercises and functional activities, goals with pt and provides final recommendations. Pt states understanding all, knowing HEP, and ready for D/C now   PATIENT EDUCATION: Education details: See tx section above for details  Person educated: Patient Education method: Verbal Instruction, Teach back, Handouts  Education comprehension: States and demonstrates understanding, Additional Education required    HOME EXERCISE PROGRAM: Access  Code: [email or web address] URL: [email or web address] Date: 01/13/2022 Prepared by: Benito Mccreedy   GOALS: Goals reviewed with patient? Yes   SHORT TERM GOALS: (STG required if POC>30 days) Target Date: 01/30/22  Pt will obtain protective or mobilizing custom orthotic to help manage stiffness or protect painful joints, etc.  Goal status: INITIAL, PRN  2.  Pt will demo/state understanding of initial HEP to improve pain levels and prerequisite motion. Goal status: MET   LONG TERM GOALS: Target Date: 02/27/22  Pt will improve functional ability by decreased impairment per Quick DASH assessment from  34% to 15% or better, for better quality of life. Goal status: 02/05/22: MET 13% today  2.  Pt will improve grip strength in Lt hand from 31lbs to at least 40lbs for functional use at home and in IADLs. Goal status: 02/05/22- Partially met 36# no pain and we discussed how to keep on with this   3.  Pt will improve A/ROM in Lt RF total active motion (TAM) from 167* to at least 200*, to have functional motion for tasks like reach and grasp.  Goal status: 02/05/22: MET- 203*   4.  Pt will improve strength in Rt hand and wrist to at least 4+/5 MMT to have increased functional ability to carry out selfcare and higher-level homecare tasks with no difficulty. Goal status: 02/05/22: MET  5.  Pt will improve coordination skills in Rt dom hand, as seen by better score on 9HPT testing to 20sec or better, to have increased functional ability to carry out fine motor tasks (fasteners, etc.) and more complex, coordinated IADLs (meal prep, sports, etc.).  Goal status: 02/05/22: MET    ASSESSMENT:  CLINICAL IMPRESSION: 02/05/22: She has met all goals to satisfactory levels, has no pain now feels great and is ready for self-management, discharged today.   PLAN: OT FREQUENCY: D/C successfully now   OT DURATION:D/C successfully now   PLANNED INTERVENTIONS: self care/ADL training, therapeutic  exercise, therapeutic activity, neuromuscular re-education, manual therapy, passive range of motion, splinting, electrical stimulation, ultrasound, fluidotherapy, compression bandaging, moist heat, cryotherapy, contrast bath, patient/family education, energy conservation, coping strategies training, and DME and/or AE instructions  RECOMMENDED OTHER SERVICES: none now   CONSULTED AND AGREED WITH PLAN OF CARE: Patient  PLAN FOR NEXT SESSION:  D/C successfully now    [employer], OTR/L, CHT 02/05/2022, 3:07 PM   OCCUPATIONAL THERAPY DISCHARGE SUMMARY  Visits from Start of Care: 5  Current functional level related to goals / functional outcomes: Pt has met all goals to satisfactory levels and is pleased with outcomes.   Remaining deficits: Pt has no more significant functional deficits or pain.   Education / Equipment: Pt has all needed materials and education. Pt understands how to continue on with self-management. See tx notes for more details.   Patient agrees to discharge due to max benefits received from outpatient occupational therapy / hand therapy at this time.   Benito Mccreedy, OTR/L, CHT 02/05/22

## 2022-02-05 ENCOUNTER — Encounter: Payer: Self-pay | Admitting: Rehabilitative and Restorative Service Providers"

## 2022-02-05 ENCOUNTER — Ambulatory Visit: Payer: Medicare HMO | Admitting: Rehabilitative and Restorative Service Providers"

## 2022-02-05 DIAGNOSIS — M6281 Muscle weakness (generalized): Secondary | ICD-10-CM | POA: Diagnosis not present

## 2022-02-05 DIAGNOSIS — M25642 Stiffness of left hand, not elsewhere classified: Secondary | ICD-10-CM | POA: Diagnosis not present

## 2022-02-05 DIAGNOSIS — R600 Localized edema: Secondary | ICD-10-CM | POA: Diagnosis not present

## 2022-02-05 DIAGNOSIS — R278 Other lack of coordination: Secondary | ICD-10-CM | POA: Diagnosis not present

## 2022-02-05 DIAGNOSIS — M25531 Pain in right wrist: Secondary | ICD-10-CM | POA: Diagnosis not present

## 2022-02-05 DIAGNOSIS — M25542 Pain in joints of left hand: Secondary | ICD-10-CM

## 2022-02-05 DIAGNOSIS — R202 Paresthesia of skin: Secondary | ICD-10-CM

## 2022-02-05 DIAGNOSIS — M79641 Pain in right hand: Secondary | ICD-10-CM | POA: Diagnosis not present

## 2022-02-05 DIAGNOSIS — M25641 Stiffness of right hand, not elsewhere classified: Secondary | ICD-10-CM | POA: Diagnosis not present

## 2022-02-05 DIAGNOSIS — M25532 Pain in left wrist: Secondary | ICD-10-CM | POA: Diagnosis not present

## 2022-02-12 ENCOUNTER — Ambulatory Visit (HOSPITAL_BASED_OUTPATIENT_CLINIC_OR_DEPARTMENT_OTHER): Payer: Medicare HMO

## 2022-02-13 NOTE — Progress Notes (Unsigned)
   I, [name], LAT, ATC acting as a scribe for [name], MD.  Sherri Smith is a 76 y.o. female who presents to [place] at [facility] today for her 6-wk f/u of bilat hand pain and R CTS. Pt is R-hand dominate. Pt was last seen by Dr. Georgina Snell on 01/08/22 and was given a R carpal tunnel steroid injection and advised to cont night splint, prescribed gabapentin, and was referred to OT for hand therapy, completing 5 visits, d/c 11/9. Today, pt reports  Dx imaging: 01/19/22 R & L hand XR  Pertinent review of systems: ***  Relevant historical information: ***   Exam:  There were no vitals taken for this visit. General: Well Developed, well nourished, and in no acute distress.   MSK: ***    Lab and Radiology Results No results found for this or any previous visit (from the past 72 hour(s)). No results found.     Assessment and Plan: 76 y.o. female with ***   PDMP not reviewed this encounter. No orders of the defined types were placed in this encounter.  No orders of the defined types were placed in this encounter.    Discussed warning signs or symptoms. Please see discharge instructions. Patient expresses understanding.   ***

## 2022-02-16 ENCOUNTER — Ambulatory Visit (INDEPENDENT_AMBULATORY_CARE_PROVIDER_SITE_OTHER): Payer: Medicare HMO | Admitting: Family Medicine

## 2022-02-16 VITALS — BP 136/84 | HR 64 | Ht 64.0 in | Wt 227.0 lb

## 2022-02-16 DIAGNOSIS — E785 Hyperlipidemia, unspecified: Secondary | ICD-10-CM | POA: Diagnosis not present

## 2022-02-16 DIAGNOSIS — M79642 Pain in left hand: Secondary | ICD-10-CM

## 2022-02-16 DIAGNOSIS — M79641 Pain in right hand: Secondary | ICD-10-CM | POA: Diagnosis not present

## 2022-02-16 DIAGNOSIS — M19049 Primary osteoarthritis, unspecified hand: Secondary | ICD-10-CM | POA: Diagnosis not present

## 2022-02-16 DIAGNOSIS — G5601 Carpal tunnel syndrome, right upper limb: Secondary | ICD-10-CM | POA: Diagnosis not present

## 2022-02-16 NOTE — Patient Instructions (Signed)
Thank you for coming in today.   Recheck as needed.   Continue home exercise program and wrist brace.   Recheck with me as needed.   Use the gabapentin as needed.

## 2022-02-23 ENCOUNTER — Ambulatory Visit (HOSPITAL_BASED_OUTPATIENT_CLINIC_OR_DEPARTMENT_OTHER)
Admission: RE | Admit: 2022-02-23 | Discharge: 2022-02-23 | Disposition: A | Discharge status: Home / Self Care | Payer: Medicare HMO | Source: Ambulatory Visit | Attending: Physician Assistant | Admitting: Physician Assistant

## 2022-02-23 ENCOUNTER — Encounter (HOSPITAL_BASED_OUTPATIENT_CLINIC_OR_DEPARTMENT_OTHER): Payer: Self-pay

## 2022-02-23 DIAGNOSIS — E782 Mixed hyperlipidemia: Secondary | ICD-10-CM | POA: Insufficient documentation

## 2022-03-09 ENCOUNTER — Other Ambulatory Visit: Payer: Self-pay | Admitting: Physician Assistant

## 2022-04-02 DIAGNOSIS — M9904 Segmental and somatic dysfunction of sacral region: Secondary | ICD-10-CM | POA: Diagnosis not present

## 2022-04-02 DIAGNOSIS — M9903 Segmental and somatic dysfunction of lumbar region: Secondary | ICD-10-CM | POA: Diagnosis not present

## 2022-04-02 DIAGNOSIS — M9905 Segmental and somatic dysfunction of pelvic region: Secondary | ICD-10-CM | POA: Diagnosis not present

## 2022-04-02 DIAGNOSIS — M9901 Segmental and somatic dysfunction of cervical region: Secondary | ICD-10-CM | POA: Diagnosis not present

## 2022-04-19 DIAGNOSIS — K089 Disorder of teeth and supporting structures, unspecified: Secondary | ICD-10-CM | POA: Diagnosis not present

## 2022-04-19 DIAGNOSIS — K121 Other forms of stomatitis: Secondary | ICD-10-CM | POA: Diagnosis not present

## 2022-04-21 DIAGNOSIS — M9905 Segmental and somatic dysfunction of pelvic region: Secondary | ICD-10-CM | POA: Diagnosis not present

## 2022-04-21 DIAGNOSIS — M9903 Segmental and somatic dysfunction of lumbar region: Secondary | ICD-10-CM | POA: Diagnosis not present

## 2022-04-21 DIAGNOSIS — M9901 Segmental and somatic dysfunction of cervical region: Secondary | ICD-10-CM | POA: Diagnosis not present

## 2022-04-21 DIAGNOSIS — M9904 Segmental and somatic dysfunction of sacral region: Secondary | ICD-10-CM | POA: Diagnosis not present

## 2022-04-22 ENCOUNTER — Encounter: Payer: Self-pay | Admitting: Physician Assistant

## 2022-04-22 ENCOUNTER — Ambulatory Visit (INDEPENDENT_AMBULATORY_CARE_PROVIDER_SITE_OTHER): Payer: Medicare HMO | Admitting: Physician Assistant

## 2022-04-22 VITALS — BP 128/70 | HR 62 | Temp 97.5°F | Ht 64.0 in | Wt 232.0 lb

## 2022-04-22 DIAGNOSIS — J029 Acute pharyngitis, unspecified: Secondary | ICD-10-CM | POA: Diagnosis not present

## 2022-04-22 LAB — POCT RAPID STREP A (OFFICE): Rapid Strep A Screen: POSITIVE — AB

## 2022-04-22 MED ORDER — AMOXICILLIN-POT CLAVULANATE 875-125 MG PO TABS: 1.0000 | ORAL_TABLET | Freq: Two times a day (BID) | ORAL | 0 refills | Status: DC

## 2022-04-22 MED ORDER — IBUPROFEN 600 MG PO TABS: 600.0000 mg | ORAL_TABLET | Freq: Three times a day (TID) | ORAL | 0 refills | Status: DC | PRN

## 2022-04-22 NOTE — Patient Instructions (Addendum)
It was great to see you!  You have strep throat You are on appropriate treatment for this  Please start 600 mg ibuprofen as ordered for 1-3 days to see if this helps with your pain -- take with food and also start over the counter acid reducer (such as zantac or prilosec) to protect stomach while taking this  If no improvement in a few days, STOP penicillin and start oral augmentin antibiotic that was sent in   Contact a health care provider if: You have swelling in your neck that keeps getting bigger. You develop a rash, cough, or earache. You cough up a thick mucus that is green, yellow-brown, or bloody. You have pain or discomfort that does not get better with medicine. Your symptoms seem to be getting worse. You have a fever. Get help right away if: You have new symptoms, such as vomiting, severe headache, stiff or painful neck, chest pain, or shortness of breath. You have severe throat pain, drooling, or changes in your voice. You have swelling of the neck, or the skin on the neck becomes red and tender. You have signs of dehydration, such as tiredness (fatigue), dry mouth, and decreased urination. You become increasingly sleepy, or you cannot wake up completely. Your joints become red or painful. These symptoms may represent a serious problem that is an emergency. Do not wait to see if the symptoms will go away. Get medical help right away. Call your local emergency services (911 in the [place] Do not drive yourself to the hospital.

## 2022-04-22 NOTE — Progress Notes (Signed)
Sherri Smith is a 77 y.o. female here for a new problem.  History of Present Illness:   Chief Complaint  Patient presents with   Sore Throat    Pt c/o sore throat x 7 days, went to UC on 'Sunday and was treated with penicillin still no better.   Sore Throat She reports having mouth sores and a sore throat for the past x7 days after having a dental procedure on 04/14/2021. She is treating at home with saline gargles, ibuprofen and Tylenol. She visited an urgent care on 04/19/2022 and was given Penicillin, which has only provided slight relief. She no longer has mouth sores but she has experienced little improvement in the sore throat.  Denies: fevers, chills, inability to control secretions, drooling   Past Medical History:  Diagnosis Date   Hyperlipidemia      Social History   Tobacco Use   Smoking status: Never   Smokeless tobacco: Never  Vaping Use   Vaping Use: Never used  Substance Use Topics   Alcohol use: Yes    Alcohol/week: 4.0 standard drinks of alcohol    Types: 4 Glasses of wine per week    Comment: one glass 5-6x/week   Drug use: Never    Past Surgical History:  Procedure Laterality Date   APPENDECTOMY     OVARIAN CYST REMOVAL     19'$ 21    Family History  Problem Relation Age of Onset   Breast cancer Mother    Osteoarthritis Mother    Depression Mother    Heart disease Mother    Prostate cancer Father    Osteoarthritis Father    Hyperlipidemia Father    Hypertension Father    Heart disease Maternal Grandmother    Heart disease Maternal Grandfather     No Known Allergies  Current Medications:   Current Outpatient Medications:    atorvastatin (LIPITOR) 20 MG tablet, TAKE 1 TABLET(20 MG) BY MOUTH DAILY, Disp: 90 tablet, Rfl: 2   Multiple Vitamin (MULTIVITAMIN) tablet, Take 1 tablet by mouth daily., Disp: , Rfl:    Multiple Vitamins-Minerals (MULTIVITAMIN WITH MINERALS) tablet, Take 1 tablet by mouth every evening., Disp: , Rfl:    penicillin  v potassium (VEETID) 500 MG tablet, Take 500 mg by mouth 4 (four) times daily., Disp: , Rfl:    sertraline (ZOLOFT) 50 MG tablet, TAKE 1 TABLET(50 MG) BY MOUTH DAILY, Disp: 90 tablet, Rfl: 0   VITAMIN D PO, Take 125 mcg by mouth. K 2 included, Disp: , Rfl:    Review of Systems:   Review of Systems  Constitutional:  Negative for fever and malaise/fatigue.  HENT:  Positive for sore throat. Negative for congestion.   Eyes:  Negative for blurred vision.  Respiratory:  Positive for cough (Minimal). Negative for shortness of breath.   Cardiovascular:  Negative for chest pain, palpitations and leg swelling.  Gastrointestinal:  Negative for vomiting.  Musculoskeletal:  Negative for back pain.  Skin:  Negative for rash.  Neurological:  Negative for loss of consciousness and headaches.    Vitals:   Vitals:   04/22/22 1537  BP: 128/70  Pulse: 62  Temp: (!) 97.5 F (36.4 C)  TempSrc: Temporal  SpO2: 97%  Weight: 232 lb (105.2 kg)  Height: '5\' 4"'$  (1.626 m)     Body mass index is 39.82 kg/m.  Physical Exam:   Physical Exam Vitals and nursing note reviewed.  Constitutional:      General: She is not in acute distress.  Appearance: She is well-developed. She is not ill-appearing or toxic-appearing.  HENT:     Head: Normocephalic and atraumatic.     Right Ear: Tympanic membrane, ear canal and external ear normal. Tympanic membrane is not erythematous, retracted or bulging.     Left Ear: Tympanic membrane, ear canal and external ear normal. Tympanic membrane is not erythematous, retracted or bulging.     Nose: Nose normal.     Right Sinus: No maxillary sinus tenderness or frontal sinus tenderness.     Left Sinus: No maxillary sinus tenderness or frontal sinus tenderness.     Mouth/Throat:     Pharynx: Uvula midline. Posterior oropharyngeal erythema present.     Tonsils: 0 on the right. 0 on the left.  Eyes:     General: Lids are normal.     Conjunctiva/sclera: Conjunctivae normal.   Neck:     Trachea: Trachea normal.  Cardiovascular:     Rate and Rhythm: Normal rate and regular rhythm.     Pulses: Normal pulses.     Heart sounds: Normal heart sounds, S1 normal and S2 normal.  Pulmonary:     Effort: Pulmonary effort is normal.     Breath sounds: Normal breath sounds. No decreased breath sounds, wheezing, rhonchi or rales.  Lymphadenopathy:     Cervical: No cervical adenopathy.  Skin:    General: Skin is warm and dry.  Neurological:     Mental Status: She is alert.     GCS: GCS eye subscore is 4. GCS verbal subscore is 5. GCS motor subscore is 6.  Psychiatric:        Speech: Speech normal.        Behavior: Behavior normal. Behavior is cooperative.    Results for orders placed or performed in visit on 04/22/22  POCT rapid strep A  Result Value Ref Range   Rapid Strep A Screen Positive (A) Negative     Assessment and Plan:   Sore throat No red flags on exam.   Recommendations as follows: --Please start 600 mg ibuprofen as ordered for 1-3 days to see if this helps with your pain -- take with food and also start over the counter acid reducer (such as zantac or prilosec) to protect stomach while taking this  If no improvement in a few days, STOP penicillin and start oral augmentin antibiotic that was sent in   Discussed taking medications as prescribed. Reviewed return precautions including worsening fever, SOB, worsening cough or other concerns. Push fluids and rest. I recommend that patient follow-up if symptoms worsen or persist despite treatment x 7-10 days, sooner if needed.  I,Alexander Ruley,acting as a Education administrator for [employer], PA.,have documented all relevant documentation on the behalf of [name], PA,as directed by  [name], PA while in the presence of [name], [place].  I, [name], [place], have reviewed all documentation for this visit. The documentation on 04/22/22 for the exam, diagnosis, procedures, and orders are all  accurate and complete.   [name], PA-C

## 2022-04-23 ENCOUNTER — Ambulatory Visit: Payer: Medicare HMO | Admitting: Physician Assistant

## 2022-05-07 DIAGNOSIS — M9903 Segmental and somatic dysfunction of lumbar region: Secondary | ICD-10-CM | POA: Diagnosis not present

## 2022-05-07 DIAGNOSIS — M9905 Segmental and somatic dysfunction of pelvic region: Secondary | ICD-10-CM | POA: Diagnosis not present

## 2022-05-07 DIAGNOSIS — M9901 Segmental and somatic dysfunction of cervical region: Secondary | ICD-10-CM | POA: Diagnosis not present

## 2022-05-07 DIAGNOSIS — M9904 Segmental and somatic dysfunction of sacral region: Secondary | ICD-10-CM | POA: Diagnosis not present

## 2022-05-19 DIAGNOSIS — M9903 Segmental and somatic dysfunction of lumbar region: Secondary | ICD-10-CM | POA: Diagnosis not present

## 2022-05-19 DIAGNOSIS — M9905 Segmental and somatic dysfunction of pelvic region: Secondary | ICD-10-CM | POA: Diagnosis not present

## 2022-05-19 DIAGNOSIS — M9901 Segmental and somatic dysfunction of cervical region: Secondary | ICD-10-CM | POA: Diagnosis not present

## 2022-05-19 DIAGNOSIS — M9904 Segmental and somatic dysfunction of sacral region: Secondary | ICD-10-CM | POA: Diagnosis not present

## 2022-05-21 ENCOUNTER — Encounter: Payer: Medicare HMO | Admitting: Psychology

## 2022-05-26 NOTE — Progress Notes (Signed)
Sherri Smith is a 77 y.o. female here for a new problem.  History of Present Illness:   No chief complaint on file.   HPI  Possible Right Otitis Media Reports experiencing drainage from right ear since    Past Medical History:  Diagnosis Date   Hyperlipidemia      Social History   Tobacco Use   Smoking status: Never   Smokeless tobacco: Never  Vaping Use   Vaping Use: Never used  Substance Use Topics   Alcohol use: Yes    Alcohol/week: 4.0 standard drinks of alcohol    Types: 4 Glasses of wine per week    Comment: one glass 5-6x/week   Drug use: Never    Past Surgical History:  Procedure Laterality Date   APPENDECTOMY     OVARIAN CYST REMOVAL     1972    Family History  Problem Relation Age of Onset   Breast cancer Mother    Osteoarthritis Mother    Depression Mother    Heart disease Mother    Prostate cancer Father    Osteoarthritis Father    Hyperlipidemia Father    Hypertension Father    Heart disease Maternal Grandmother    Heart disease Maternal Grandfather     No Known Allergies  Current Medications:   Current Outpatient Medications:    amoxicillin-clavulanate (AUGMENTIN) 875-125 MG tablet, Take 1 tablet by mouth 2 (two) times daily., Disp: 20 tablet, Rfl: 0   atorvastatin (LIPITOR) 20 MG tablet, TAKE 1 TABLET(20 MG) BY MOUTH DAILY, Disp: 90 tablet, Rfl: 2   ibuprofen (ADVIL) 600 MG tablet, Take 1 tablet (600 mg total) by mouth every 8 (eight) hours as needed., Disp: 30 tablet, Rfl: 0   Multiple Vitamin (MULTIVITAMIN) tablet, Take 1 tablet by mouth daily., Disp: , Rfl:    Multiple Vitamins-Minerals (MULTIVITAMIN WITH MINERALS) tablet, Take 1 tablet by mouth every evening., Disp: , Rfl:    penicillin v potassium (VEETID) 500 MG tablet, Take 500 mg by mouth 4 (four) times daily., Disp: , Rfl:    sertraline (ZOLOFT) 50 MG tablet, TAKE 1 TABLET(50 MG) BY MOUTH DAILY, Disp: 90 tablet, Rfl: 0   VITAMIN D PO, Take 125 mcg by mouth. K 2 included,  Disp: , Rfl:    Review of Systems:   ROS  Vitals:   There were no vitals filed for this visit.   There is no height or weight on file to calculate BMI.  Physical Exam:   Physical Exam  Assessment and Plan:   ***   I,Alexander Ruley,acting as a scribe for [name], PA.,have documented all relevant documentation on the behalf of [name], PA,as directed by  [name], PA while in the presence of [name], [place].   ***   [name], PA-C

## 2022-05-27 ENCOUNTER — Ambulatory Visit (INDEPENDENT_AMBULATORY_CARE_PROVIDER_SITE_OTHER): Payer: Medicare HMO | Admitting: Physician Assistant

## 2022-05-27 ENCOUNTER — Encounter: Payer: Self-pay | Admitting: Physician Assistant

## 2022-05-27 VITALS — BP 124/76 | HR 67 | Temp 98.0°F | Ht 64.0 in

## 2022-05-27 DIAGNOSIS — H60501 Unspecified acute noninfective otitis externa, right ear: Secondary | ICD-10-CM | POA: Diagnosis not present

## 2022-05-27 DIAGNOSIS — H6993 Unspecified Eustachian tube disorder, bilateral: Secondary | ICD-10-CM

## 2022-05-27 MED ORDER — CIPROFLOXACIN-DEXAMETHASONE 0.3-0.1 % OT SUSP: 4.0000 [drp] | Freq: Two times a day (BID) | OTIC | 0 refills | Status: DC

## 2022-05-27 NOTE — Patient Instructions (Signed)
It was great to see you!  Start the antihistamine nasal spray Use the ear drops for a few days Keep me posted  Keep pushing forward!  Push fluids and get plenty of rest. Please return if you are not improving as expected, or if you have high fevers (>101.5) or difficulty swallowing or worsening productive cough.  Call clinic with questions.  I hope you start feeling better soon!

## 2022-05-29 ENCOUNTER — Other Ambulatory Visit: Payer: Self-pay | Admitting: *Deleted

## 2022-05-29 MED ORDER — OFLOXACIN 0.3 % OT SOLN: 5.0000 [drp] | Freq: Four times a day (QID) | OTIC | 0 refills | Status: DC

## 2022-05-29 NOTE — Progress Notes (Signed)
Spoke to pt told her Rx Samantha sent to the pharmacy for your ear was not covered. Pt said yes.  I am sending a new Rx to the pharmacy for you under Dr. Marigene Ehlers name. Pt verbalized understanding.

## 2022-06-01 ENCOUNTER — Encounter: Payer: Medicare HMO | Admitting: Psychology

## 2022-06-02 DIAGNOSIS — M9901 Segmental and somatic dysfunction of cervical region: Secondary | ICD-10-CM | POA: Diagnosis not present

## 2022-06-02 DIAGNOSIS — M9903 Segmental and somatic dysfunction of lumbar region: Secondary | ICD-10-CM | POA: Diagnosis not present

## 2022-06-02 DIAGNOSIS — M9904 Segmental and somatic dysfunction of sacral region: Secondary | ICD-10-CM | POA: Diagnosis not present

## 2022-06-02 DIAGNOSIS — M9905 Segmental and somatic dysfunction of pelvic region: Secondary | ICD-10-CM | POA: Diagnosis not present

## 2022-06-08 DIAGNOSIS — G4733 Obstructive sleep apnea (adult) (pediatric): Secondary | ICD-10-CM | POA: Diagnosis not present

## 2022-06-09 DIAGNOSIS — M9903 Segmental and somatic dysfunction of lumbar region: Secondary | ICD-10-CM | POA: Diagnosis not present

## 2022-06-09 DIAGNOSIS — M9904 Segmental and somatic dysfunction of sacral region: Secondary | ICD-10-CM | POA: Diagnosis not present

## 2022-06-09 DIAGNOSIS — M9901 Segmental and somatic dysfunction of cervical region: Secondary | ICD-10-CM | POA: Diagnosis not present

## 2022-06-09 DIAGNOSIS — M9905 Segmental and somatic dysfunction of pelvic region: Secondary | ICD-10-CM | POA: Diagnosis not present

## 2022-06-16 DIAGNOSIS — M9903 Segmental and somatic dysfunction of lumbar region: Secondary | ICD-10-CM | POA: Diagnosis not present

## 2022-06-16 DIAGNOSIS — M9904 Segmental and somatic dysfunction of sacral region: Secondary | ICD-10-CM | POA: Diagnosis not present

## 2022-06-16 DIAGNOSIS — M9901 Segmental and somatic dysfunction of cervical region: Secondary | ICD-10-CM | POA: Diagnosis not present

## 2022-06-16 DIAGNOSIS — M9905 Segmental and somatic dysfunction of pelvic region: Secondary | ICD-10-CM | POA: Diagnosis not present

## 2022-06-30 DIAGNOSIS — M9904 Segmental and somatic dysfunction of sacral region: Secondary | ICD-10-CM | POA: Diagnosis not present

## 2022-06-30 DIAGNOSIS — M9903 Segmental and somatic dysfunction of lumbar region: Secondary | ICD-10-CM | POA: Diagnosis not present

## 2022-06-30 DIAGNOSIS — M9901 Segmental and somatic dysfunction of cervical region: Secondary | ICD-10-CM | POA: Diagnosis not present

## 2022-06-30 DIAGNOSIS — M9905 Segmental and somatic dysfunction of pelvic region: Secondary | ICD-10-CM | POA: Diagnosis not present

## 2022-07-14 DIAGNOSIS — M9905 Segmental and somatic dysfunction of pelvic region: Secondary | ICD-10-CM | POA: Diagnosis not present

## 2022-07-14 DIAGNOSIS — M9903 Segmental and somatic dysfunction of lumbar region: Secondary | ICD-10-CM | POA: Diagnosis not present

## 2022-07-14 DIAGNOSIS — M9901 Segmental and somatic dysfunction of cervical region: Secondary | ICD-10-CM | POA: Diagnosis not present

## 2022-07-14 DIAGNOSIS — M9904 Segmental and somatic dysfunction of sacral region: Secondary | ICD-10-CM | POA: Diagnosis not present

## 2022-08-06 DIAGNOSIS — M9905 Segmental and somatic dysfunction of pelvic region: Secondary | ICD-10-CM | POA: Diagnosis not present

## 2022-08-06 DIAGNOSIS — M9901 Segmental and somatic dysfunction of cervical region: Secondary | ICD-10-CM | POA: Diagnosis not present

## 2022-08-06 DIAGNOSIS — M9904 Segmental and somatic dysfunction of sacral region: Secondary | ICD-10-CM | POA: Diagnosis not present

## 2022-08-06 DIAGNOSIS — M9903 Segmental and somatic dysfunction of lumbar region: Secondary | ICD-10-CM | POA: Diagnosis not present

## 2022-08-17 ENCOUNTER — Other Ambulatory Visit: Payer: Self-pay | Admitting: Physician Assistant

## 2022-08-17 ENCOUNTER — Encounter: Payer: Medicare HMO | Admitting: Psychology

## 2022-08-20 DIAGNOSIS — M9903 Segmental and somatic dysfunction of lumbar region: Secondary | ICD-10-CM | POA: Diagnosis not present

## 2022-08-20 DIAGNOSIS — M9905 Segmental and somatic dysfunction of pelvic region: Secondary | ICD-10-CM | POA: Diagnosis not present

## 2022-08-20 DIAGNOSIS — M9904 Segmental and somatic dysfunction of sacral region: Secondary | ICD-10-CM | POA: Diagnosis not present

## 2022-08-20 DIAGNOSIS — M9901 Segmental and somatic dysfunction of cervical region: Secondary | ICD-10-CM | POA: Diagnosis not present

## 2022-08-26 ENCOUNTER — Encounter: Payer: Medicare HMO | Admitting: Psychology

## 2022-09-03 DIAGNOSIS — M9905 Segmental and somatic dysfunction of pelvic region: Secondary | ICD-10-CM | POA: Diagnosis not present

## 2022-09-03 DIAGNOSIS — M9901 Segmental and somatic dysfunction of cervical region: Secondary | ICD-10-CM | POA: Diagnosis not present

## 2022-09-03 DIAGNOSIS — M9903 Segmental and somatic dysfunction of lumbar region: Secondary | ICD-10-CM | POA: Diagnosis not present

## 2022-09-03 DIAGNOSIS — M9904 Segmental and somatic dysfunction of sacral region: Secondary | ICD-10-CM | POA: Diagnosis not present

## 2022-09-17 DIAGNOSIS — M9905 Segmental and somatic dysfunction of pelvic region: Secondary | ICD-10-CM | POA: Diagnosis not present

## 2022-09-17 DIAGNOSIS — M9903 Segmental and somatic dysfunction of lumbar region: Secondary | ICD-10-CM | POA: Diagnosis not present

## 2022-09-17 DIAGNOSIS — M9904 Segmental and somatic dysfunction of sacral region: Secondary | ICD-10-CM | POA: Diagnosis not present

## 2022-09-17 DIAGNOSIS — M9901 Segmental and somatic dysfunction of cervical region: Secondary | ICD-10-CM | POA: Diagnosis not present

## 2022-09-29 NOTE — Progress Notes (Signed)
Sherri Smith is a 77 y.o. female here for a new problem.  History of Present Illness:   No chief complaint on file.   HPI  History of hyperlipidemia treated with atorvastatin 20 mg daily.   Past Medical History:  Diagnosis Date   Hyperlipidemia      Social History   Tobacco Use   Smoking status: Never   Smokeless tobacco: Never  Vaping Use   Vaping Use: Never used  Substance Use Topics   Alcohol use: Yes    Alcohol/week: 4.0 standard drinks of alcohol    Types: 4 Glasses of wine per week    Comment: one glass 5-6x/week   Drug use: Never    Past Surgical History:  Procedure Laterality Date   APPENDECTOMY     OVARIAN CYST REMOVAL     1972    Family History  Problem Relation Age of Onset   Breast cancer Mother    Osteoarthritis Mother    Depression Mother    Heart disease Mother    Prostate cancer Father    Osteoarthritis Father    Hyperlipidemia Father    Hypertension Father    Heart disease Maternal Grandmother    Heart disease Maternal Grandfather     No Known Allergies  Current Medications:   Current Outpatient Medications:    amoxicillin-clavulanate (AUGMENTIN) 875-125 MG tablet, Take 1 tablet by mouth 2 (two) times daily., Disp: 20 tablet, Rfl: 0   atorvastatin (LIPITOR) 20 MG tablet, TAKE 1 TABLET(20 MG) BY MOUTH DAILY, Disp: 90 tablet, Rfl: 2   ciprofloxacin-dexamethasone (CIPRODEX) OTIC suspension, Place 4 drops into the right ear 2 (two) times daily., Disp: 7.5 mL, Rfl: 0   Multiple Vitamin (MULTIVITAMIN) tablet, Take 1 tablet by mouth daily., Disp: , Rfl:    Multiple Vitamins-Minerals (MULTIVITAMIN WITH MINERALS) tablet, Take 1 tablet by mouth every evening., Disp: , Rfl:    ofloxacin (FLOXIN) 0.3 % OTIC solution, Place 5 drops into the right ear in the morning, at noon, in the evening, and at bedtime., Disp: 7 mL, Rfl: 0   penicillin v potassium (VEETID) 500 MG tablet, Take 500 mg by mouth 4 (four) times daily., Disp: , Rfl:    sertraline  (ZOLOFT) 50 MG tablet, TAKE 1 TABLET(50 MG) BY MOUTH DAILY, Disp: 90 tablet, Rfl: 0   VITAMIN D PO, Take 125 mcg by mouth. K 2 included, Disp: , Rfl:    Review of Systems:   ROS  Vitals:   There were no vitals filed for this visit.   There is no height or weight on file to calculate BMI.  Physical Exam:   Physical Exam  Assessment and Plan:   ***   I,Alexander Ruley,acting as a scribe for Jarold Motto, PA.,have documented all relevant documentation on the behalf of Jarold Motto, PA,as directed by  Jarold Motto, PA while in the presence of Jarold Motto, [place].   ***   Jarold Motto, PA-C

## 2022-09-30 ENCOUNTER — Ambulatory Visit (INDEPENDENT_AMBULATORY_CARE_PROVIDER_SITE_OTHER): Payer: Medicare HMO | Admitting: Physician Assistant

## 2022-09-30 ENCOUNTER — Encounter: Payer: Self-pay | Admitting: Physician Assistant

## 2022-09-30 VITALS — BP 136/80 | HR 57 | Temp 97.8°F | Ht 64.0 in | Wt 237.0 lb

## 2022-09-30 DIAGNOSIS — Z8249 Family history of ischemic heart disease and other diseases of the circulatory system: Secondary | ICD-10-CM | POA: Diagnosis not present

## 2022-09-30 DIAGNOSIS — G4700 Insomnia, unspecified: Secondary | ICD-10-CM

## 2022-09-30 DIAGNOSIS — R29898 Other symptoms and signs involving the musculoskeletal system: Secondary | ICD-10-CM | POA: Diagnosis not present

## 2022-09-30 DIAGNOSIS — M7989 Other specified soft tissue disorders: Secondary | ICD-10-CM

## 2022-09-30 DIAGNOSIS — E785 Hyperlipidemia, unspecified: Secondary | ICD-10-CM

## 2022-09-30 LAB — CBC WITH DIFFERENTIAL/PLATELET
Basophils Relative: 0.4 %
Eosinophils Absolute: 265 cells/uL (ref 15–500)
Eosinophils Relative: 2.5 %
HCT: 40.5 % (ref 35.0–45.0)
Lymphs Abs: 4590 cells/uL — ABNORMAL HIGH (ref 850–3900)
MCV: 88 fL (ref 80.0–100.0)
Neutrophils Relative %: 48.4 %

## 2022-09-30 MED ORDER — FUROSEMIDE 20 MG PO TABS: 20.0000 mg | ORAL_TABLET | Freq: Every day | ORAL | 1 refills | Status: DC | PRN

## 2022-09-30 NOTE — Patient Instructions (Signed)
It was great to see you!  Referral to physical therapy at [facility]  Referral to Dr Jodelle Red with Cardiology -- she is amazing  Trial lasix 20 mg daily as needed for swelling Message me in a week or so to let me know how your swelling and weights are doing  Call us: Anytime you have any of the following symptoms: 1) 3 pound weight gain in 24 hours or 5 pounds in 1 week 2) shortness of breath, with or without a dry hacking cough 3) swelling in the hands, feet or stomach 4) if you have to sleep on extra pillows at night in order to breathe.   Take care,  Jarold Motto PA-C

## 2022-10-01 LAB — COMPREHENSIVE METABOLIC PANEL
AG Ratio: 1.9 (calc) (ref 1.0–2.5)
ALT: 27 U/L (ref 6–29)
AST: 27 U/L (ref 10–35)
Albumin: 4.7 g/dL (ref 3.6–5.1)
Alkaline phosphatase (APISO): 52 U/L (ref 37–153)
BUN: 16 mg/dL (ref 7–25)
CO2: 28 mmol/L (ref 20–32)
Calcium: 9.9 mg/dL (ref 8.6–10.4)
Chloride: 107 mmol/L (ref 98–110)
Creat: 0.75 mg/dL (ref 0.60–1.00)
Globulin: 2.5 g/dL (calc) (ref 1.9–3.7)
Glucose, Bld: 94 mg/dL (ref 65–99)
Potassium: 4.4 mmol/L (ref 3.5–5.3)
Sodium: 143 mmol/L (ref 135–146)
Total Bilirubin: 0.4 mg/dL (ref 0.2–1.2)
Total Protein: 7.2 g/dL (ref 6.1–8.1)

## 2022-10-01 LAB — CBC WITH DIFFERENTIAL/PLATELET
Absolute Monocytes: 572 cells/uL (ref 200–950)
Basophils Absolute: 42 cells/uL (ref 0–200)
Hemoglobin: 13.5 g/dL (ref 11.7–15.5)
MCH: 29.3 pg (ref 27.0–33.0)
MCHC: 33.3 g/dL (ref 32.0–36.0)
MPV: 11.1 fL (ref 7.5–12.5)
Monocytes Relative: 5.4 %
Neutro Abs: 5130 cells/uL (ref 1500–7800)
Platelets: 270 10*3/uL (ref 140–400)
RBC: 4.6 10*6/uL (ref 3.80–5.10)
RDW: 13.2 % (ref 11.0–15.0)
Total Lymphocyte: 43.3 %
WBC: 10.6 10*3/uL (ref 3.8–10.8)

## 2022-10-01 LAB — LIPID PANEL
Cholesterol: 139 mg/dL (ref <200)
HDL: 54 mg/dL (ref >=50)
LDL Cholesterol (Calc): 59 mg/dL (calc)
Non-HDL Cholesterol (Calc): 85 mg/dL (calc) (ref <130)
Total CHOL/HDL Ratio: 2.6 (calc) (ref <5.0)
Triglycerides: 187 mg/dL — ABNORMAL HIGH (ref <150)

## 2022-10-01 LAB — TSH: TSH: 2.61 mIU/L (ref 0.40–4.50)

## 2022-10-05 ENCOUNTER — Encounter: Payer: Self-pay | Admitting: Physician Assistant

## 2022-10-05 NOTE — Telephone Encounter (Signed)
Please see as patient update and advise

## 2022-10-13 ENCOUNTER — Ambulatory Visit: Payer: Medicare HMO

## 2022-10-13 VITALS — Wt 231.0 lb

## 2022-10-13 DIAGNOSIS — Z0000 Encounter for general adult medical examination without abnormal findings: Secondary | ICD-10-CM | POA: Diagnosis not present

## 2022-10-13 NOTE — Patient Instructions (Signed)
Sherri Smith , Thank you for taking time to come for your Medicare Wellness Visit. I appreciate your ongoing commitment to your health goals. Please review the following plan we discussed and let me know if I can assist you in the future.   These are the goals we discussed:  Goals      Patient Stated     Lose weight      Patient Stated     Be more active and lose weight      Patient Stated     Maintain better health         This is a list of the screening recommended for you and due dates:  Health Maintenance  Topic Date Due   Hepatitis C Screening  Never done   Pneumonia Vaccine (2 of 2 - PCV) 03/29/2021   DEXA scan (bone density measurement)  10/25/2022*   Colon Cancer Screening  10/25/2022*   COVID-19 Vaccine (6 - 2023-24 season) 09/30/2023*   Medicare Annual Wellness Visit  10/13/2023   DTaP/Tdap/Td vaccine (2 - Td or Tdap) 08/28/2024   Zoster (Shingles) Vaccine  Completed   HPV Vaccine  Aged Out   Flu Shot  Discontinued  *Topic was postponed. The date shown is not the original due date.    Advanced directives: Please bring a copy of your health care power of attorney and living will to the office at your convenience.   Conditions/risks identified: Maintain better health   Next appointment: Follow up in one year for your annual wellness visit    Preventive Care 65 Years and Older, Female Preventive care refers to lifestyle choices and visits with your health care provider that can promote health and wellness. What does preventive care include? A yearly physical exam. This is also called an annual well check. Dental exams once or twice a year. Routine eye exams. Ask your health care provider how often you should have your eyes checked. Personal lifestyle choices, including: Daily care of your teeth and gums. Regular physical activity. Eating a healthy diet. Avoiding tobacco and drug use. Limiting alcohol use. Practicing safe sex. Taking low-dose aspirin every  day. Taking vitamin and mineral supplements as recommended by your health care provider. What happens during an annual well check? The services and screenings done by your health care provider during your annual well check will depend on your age, overall health, lifestyle risk factors, and family history of disease. Counseling  Your health care provider may ask you questions about your: Alcohol use. Tobacco use. Drug use. Emotional well-being. Home and relationship well-being. Sexual activity. Eating habits. History of falls. Memory and ability to understand (cognition). Work and work Astronomer. Reproductive health. Screening  You may have the following tests or measurements: Height, weight, and BMI. Blood pressure. Lipid and cholesterol levels. These may be checked every 5 years, or more frequently if you are over 45 years old. Skin check. Lung cancer screening. You may have this screening every year starting at age 67 if you have a 30-pack-year history of smoking and currently smoke or have quit within the past 15 years. Fecal occult blood test (FOBT) of the stool. You may have this test every year starting at age 88. Flexible sigmoidoscopy or colonoscopy. You may have a sigmoidoscopy every 5 years or a colonoscopy every 10 years starting at age 12. Hepatitis C blood test. Hepatitis B blood test. Sexually transmitted disease (STD) testing. Diabetes screening. This is done by checking your blood sugar (glucose) after you  have not eaten for a while (fasting). You may have this done every 1-3 years. Bone density scan. This is done to screen for osteoporosis. You may have this done starting at age 30. Mammogram. This may be done every 1-2 years. Talk to your health care provider about how often you should have regular mammograms. Talk with your health care provider about your test results, treatment options, and if necessary, the need for more tests. Vaccines  Your health care  provider may recommend certain vaccines, such as: Influenza vaccine. This is recommended every year. Tetanus, diphtheria, and acellular pertussis (Tdap, Td) vaccine. You may need a Td booster every 10 years. Zoster vaccine. You may need this after age 45. Pneumococcal 13-valent conjugate (PCV13) vaccine. One dose is recommended after age 78. Pneumococcal polysaccharide (PPSV23) vaccine. One dose is recommended after age 20. Talk to your health care provider about which screenings and vaccines you need and how often you need them. This information is not intended to replace advice given to you by your health care provider. Make sure you discuss any questions you have with your health care provider. Document Released: 04/12/2015 Document Revised: 12/04/2015 Document Reviewed: 01/15/2015 Elsevier Interactive Patient Education  2017 [employer].  Fall Prevention in the Home Falls can cause injuries. They can happen to people of all ages. There are many things you can do to make your home safe and to help prevent falls. What can I do on the outside of my home? Regularly fix the edges of walkways and driveways and fix any cracks. Remove anything that might make you trip as you walk through a door, such as a raised step or threshold. Trim any bushes or trees on the path to your home. Use bright outdoor lighting. Clear any walking paths of anything that might make someone trip, such as rocks or tools. Regularly check to see if handrails are loose or broken. Make sure that both sides of any steps have handrails. Any raised decks and porches should have guardrails on the edges. Have any leaves, snow, or ice cleared regularly. Use sand or salt on walking paths during winter. Clean up any spills in your garage right away. This includes oil or grease spills. What can I do in the bathroom? Use night lights. Install grab bars by the toilet and in the tub and shower. Do not use towel bars as grab  bars. Use non-skid mats or decals in the tub or shower. If you need to sit down in the shower, use a plastic, non-slip stool. Keep the floor dry. Clean up any water that spills on the floor as soon as it happens. Remove soap buildup in the tub or shower regularly. Attach bath mats securely with double-sided non-slip rug tape. Do not have throw rugs and other things on the floor that can make you trip. What can I do in the bedroom? Use night lights. Make sure that you have a light by your bed that is easy to reach. Do not use any sheets or blankets that are too big for your bed. They should not hang down onto the floor. Have a firm chair that has side arms. You can use this for support while you get dressed. Do not have throw rugs and other things on the floor that can make you trip. What can I do in the kitchen? Clean up any spills right away. Avoid walking on wet floors. Keep items that you use a lot in easy-to-reach places. If you need  to reach something above you, use a strong step stool that has a grab bar. Keep electrical cords out of the way. Do not use floor polish or wax that makes floors slippery. If you must use wax, use non-skid floor wax. Do not have throw rugs and other things on the floor that can make you trip. What can I do with my stairs? Do not leave any items on the stairs. Make sure that there are handrails on both sides of the stairs and use them. Fix handrails that are broken or loose. Make sure that handrails are as long as the stairways. Check any carpeting to make sure that it is firmly attached to the stairs. Fix any carpet that is loose or worn. Avoid having throw rugs at the top or bottom of the stairs. If you do have throw rugs, attach them to the floor with carpet tape. Make sure that you have a light switch at the top of the stairs and the bottom of the stairs. If you do not have them, ask someone to add them for you. What else can I do to help prevent  falls? Wear shoes that: Do not have high heels. Have rubber bottoms. Are comfortable and fit you well. Are closed at the toe. Do not wear sandals. If you use a stepladder: Make sure that it is fully opened. Do not climb a closed stepladder. Make sure that both sides of the stepladder are locked into place. Ask someone to hold it for you, if possible. Clearly mark and make sure that you can see: Any grab bars or handrails. First and last steps. Where the edge of each step is. Use tools that help you move around (mobility aids) if they are needed. These include: Canes. Walkers. Scooters. Crutches. Turn on the lights when you go into a dark area. Replace any light bulbs as soon as they burn out. Set up your furniture so you have a clear path. Avoid moving your furniture around. If any of your floors are uneven, fix them. If there are any pets around you, be aware of where they are. Review your medicines with your doctor. Some medicines can make you feel dizzy. This can increase your chance of falling. Ask your doctor what other things that you can do to help prevent falls. This information is not intended to replace advice given to you by your health care provider. Make sure you discuss any questions you have with your health care provider. Document Released: 01/10/2009 Document Revised: 08/22/2015 Document Reviewed: 04/20/2014 Elsevier Interactive Patient Education  2017 [employer].

## 2022-10-13 NOTE — Progress Notes (Signed)
Subjective:   Sherri Smith is a 77 y.o. female who presents for Medicare Annual (Subsequent) preventive examination.  Per patient no change in vitals since last visit, unable to obtain new vitals due to telehealth visit   Visit Complete: Virtual  I connected with  Sherri Smith on 10/13/22 by a audio enabled telemedicine application and verified that I am speaking with the correct person using two identifiers.  Patient Location: Home  Provider Location: Office/Clinic  I discussed the limitations of evaluation and management by telemedicine. The patient expressed understanding and agreed to proceed.  Patient Medicare AWV questionnaire was completed by the patient on 10/13/22; I have confirmed that all information answered by patient is correct and no changes since this date.  Review of Systems     Cardiac Risk Factors include: advanced age (>82men, >56 women);obesity (BMI >30kg/m2)     Objective:    Today's Vitals   10/13/22 1502  Weight: 231 lb (104.8 kg)   Body mass index is 39.65 kg/m.     10/13/2022    3:08 PM 01/13/2022    1:46 PM 10/24/2021   10:36 AM 10/18/2020   10:57 AM 11/13/2016   12:40 PM 02/05/2014    3:04 PM  Advanced Directives  Does Patient Have a Medical Advance Directive? Yes No Yes Yes No No  Type of Estate agent of [place];Living will   Living will    Does patient want to make changes to medical advance directive?   Yes (MAU/Ambulatory/Procedural Areas - Information given)     Copy of Healthcare Power of Attorney in Chart? No - copy requested       Would patient like information on creating a medical advance directive?  No - Patient declined    No - patient declined information    Current Medications (verified) Outpatient Encounter Medications as of 10/13/2022  Medication Sig   atorvastatin (LIPITOR) 20 MG tablet TAKE 1 TABLET(20 MG) BY MOUTH DAILY   B Complex Vitamins (B COMPLEX-B12) TABS Take 2 tablets by mouth daily in  the afternoon.   furosemide (LASIX) 20 MG tablet Take 1 tablet (20 mg total) by mouth daily as needed for edema.   Omega-3 Fatty Acids (FISH OIL) 1000 MG CAPS Take 1 capsule by mouth daily in the afternoon.   OVER THE COUNTER MEDICATION Take 2,000 mg by mouth 3 (three) times daily. Magnesium L- Threonate   Turmeric (QC TUMERIC COMPLEX) 500 MG CAPS Take 1 capsule by mouth daily in the afternoon.   VITAMIN D PO Take 125 mcg by mouth. K 2 included   Zinc 100 MG TABS Take 1 tablet by mouth daily in the afternoon.   No facility-administered encounter medications on file as of 10/13/2022.    Allergies (verified) Patient has no known allergies.   History: Past Medical History:  Diagnosis Date   Hyperlipidemia    Past Surgical History:  Procedure Laterality Date   APPENDECTOMY     OVARIAN CYST REMOVAL     1972   Family History  Problem Relation Age of Onset   Breast cancer Mother    Osteoarthritis Mother    Depression Mother    Heart disease Mother    Prostate cancer Father    Osteoarthritis Father    Hyperlipidemia Father    Hypertension Father    Heart disease Maternal Grandmother    Heart disease Maternal Grandfather    Social History   Socioeconomic History   Marital status: Married    Spouse  name: Not on file   Number of children: Not on file   Years of education: Not on file   Highest education level: Some college, no degree  Occupational History   Not on file  Tobacco Use   Smoking status: Never   Smokeless tobacco: Never  Vaping Use   Vaping status: Never Used  Substance and Sexual Activity   Alcohol use: Yes    Alcohol/week: 4.0 standard drinks of alcohol    Types: 4 Glasses of wine per week    Comment: one glass 5-6x/week   Drug use: Never   Sexual activity: Not on file  Other Topics Concern   Not on file  Social History Narrative   From the [place]   Retired    Moved to [place] to be closer to her daughter   Social Determinants of Manufacturing engineer Strain: Low Risk  (10/13/2022)   Overall Financial Resource Strain (CARDIA)    Difficulty of Paying Living Expenses: Not hard at all  Food Insecurity: No Food Insecurity (10/13/2022)   Hunger Vital Sign    Worried About Running Out of Food in the Last Year: Never true    Ran Out of Food in the Last Year: Never true  Transportation Needs: No Transportation Needs (10/13/2022)   PRAPARE - Administrator, Civil Service (Medical): No    Lack of Transportation (Non-Medical): No  Physical Activity: Insufficiently Active (10/13/2022)   Exercise Vital Sign    Days of Exercise per Week: 3 days    Minutes of Exercise per Session: 30 min  Stress: No Stress Concern Present (10/13/2022)   [employer] of Occupational Health - Occupational Stress Questionnaire    Feeling of Stress : Not at all  Social Connections: Moderately Integrated (10/13/2022)   Social Connection and Isolation Panel [NHANES]    Frequency of Communication with Friends and Family: Once a week    Frequency of Social Gatherings with Friends and Family: Once a week    Attends Religious Services: More than 4 times per year    Active Member of [employer] or Organizations: Yes    Attends Engineer, structural: More than 4 times per year    Marital Status: Married    Tobacco Counseling Counseling given: Not Answered   Clinical Intake:  Pre-visit preparation completed: Yes  Pain : No/denies pain     BMI - recorded: 39.65 Nutritional Status: BMI > 30  Obese Nutritional Risks: None Diabetes: No  How often do you need to have someone help you when you read instructions, pamphlets, or other written materials from your doctor or pharmacy?: 1 - Never  Interpreter Needed?: No  Information entered by :: Lanier Ensign, LPN   Activities of Daily Living    10/13/2022   10:57 AM 10/24/2021   10:38 AM  In your present state of health, do you have any difficulty performing the following  activities:  Hearing? 0 0  Vision? 0 0  Difficulty concentrating or making decisions? 0 0  Walking or climbing stairs? 1 0  Comment knees   Dressing or bathing? 0 0  Doing errands, shopping? 0 0  Preparing Food and eating ? N N  Using the Toilet? N N  In the past six months, have you accidently leaked urine? Malvin Johns  Comment wears a pad wears a pad  Do you have problems with loss of bowel control? N N  Managing your Medications? N N  Managing your  Finances? N N  Housekeeping or managing your Housekeeping? N N    Patient Care Team: Jarold Motto, [place] as PCP - General (Physician Assistant)  Indicate any recent Medical Services you may have received from other than [facility] providers in the past year (date may be approximate).     Assessment:   This is a routine wellness examination for Sherri Smith.  Hearing/Vision screen Hearing Screening - Comments:: Pt denies any hearing issues  Vision Screening - Comments:: Pt follows up [facility] for annual eye exams   Dietary issues and exercise activities discussed:     Goals Addressed             This Visit's Progress    Patient Stated       Maintain better health        Depression Screen    10/13/2022    3:06 PM 09/30/2022    2:52 PM 05/27/2022    1:11 PM 01/06/2022   10:56 AM 10/24/2021   10:33 AM 06/17/2021   10:53 AM 10/18/2020   10:54 AM  PHQ 2/9 Scores  PHQ - 2 Score 0 0 0 0 0 2 1  PHQ- 9 Score 0 8 4 5  10      Fall Risk    10/13/2022   10:57 AM 05/27/2022    1:11 PM 10/24/2021   10:38 AM 10/18/2020   10:59 AM 06/28/2020   11:14 AM  Fall Risk   Falls in the past year? 1 0 0 0 0  Number falls in past yr: 0 0 0 0 0  Injury with Fall? 1 0 0 0 0  Comment right hand bruises as well on back      Risk for fall due to : Impaired vision;Impaired mobility No Fall Risks Impaired vision;Impaired balance/gait Impaired vision   Risk for fall due to: Comment knees  with vertigo at times    Follow up Falls prevention discussed  Falls evaluation completed Falls prevention discussed Falls prevention discussed     MEDICARE RISK AT HOME:   TIMED UP AND GO:  Was the test performed?  No    Cognitive Function:        10/13/2022    3:10 PM 10/24/2021   10:43 AM 10/18/2020   11:04 AM  6CIT Screen  What Year? 0 points 0 points 0 points  What month? 0 points 0 points 0 points  What time? 0 points 0 points 0 points  Count back from 20 0 points 0 points 0 points  Months in reverse 0 points 0 points 2 points  Repeat phrase 0 points 0 points 0 points  Total Score 0 points 0 points 2 points    Immunizations Immunization History  Administered Date(s) Administered   PFIZER(Purple Top)SARS-COV-2 Vaccination 05/29/2019, 06/27/2019, 01/04/2020, 08/09/2020   Pfizer Covid-19 Vaccine Bivalent Booster 50yrs & up 01/11/2021   Pneumococcal Polysaccharide-23 11/12/2011, 03/29/2020   Tdap 08/29/2014   Zoster Recombinant(Shingrix) 01/07/2021, 04/14/2021    TDAP status: Up to date  Flu Vaccine status: Up to date  Pneumococcal vaccine status: Due, Education has been provided regarding the importance of this vaccine. Advised may receive this vaccine at local pharmacy or Health Dept. Aware to provide a copy of the vaccination record if obtained from local pharmacy or Health Dept. Verbalized acceptance and understanding.  Covid-19 vaccine status: Completed vaccines  Qualifies for Shingles Vaccine? Yes   Zostavax completed Yes   Shingrix Completed?: Yes  Screening Tests Health Maintenance  Topic Date  Due   Hepatitis C Screening  Never done   Pneumonia Vaccine 60+ Years old (2 of 2 - PCV) 03/29/2021   DEXA SCAN  10/25/2022 (Originally 03/24/2012)   Colonoscopy  10/25/2022 (Originally 03/24/1992)   COVID-19 Vaccine (6 - 2023-24 season) 09/30/2023 (Originally 11/28/2021)   Medicare Annual Wellness (AWV)  10/13/2023   DTaP/Tdap/Td (2 - Td or Tdap) 08/28/2024   Zoster Vaccines- Shingrix  Completed   HPV VACCINES  Aged Out    INFLUENZA VACCINE  Discontinued    Health Maintenance  Health Maintenance Due  Topic Date Due   Hepatitis C Screening  Never done   Pneumonia Vaccine 39+ Years old (2 of 2 - PCV) 03/29/2021    Colonoscopy to be discussed   Mammogram status: No longer required due to age .     Additional Screening:  Hepatitis C Screening: does qualify  Vision Screening: Recommended annual ophthalmology exams for early detection of glaucoma and other disorders of the eye. Is the patient up to date with their annual eye exam?  Yes  Who is the provider or what is the name of the office in which the patient attends annual eye exams? [facility]  If pt is not established with a provider, would they like to be referred to a provider to establish care? No .   Dental Screening: Recommended annual dental exams for proper oral hygiene   Community Resource Referral / Chronic Care Management: CRR required this visit?  No   CCM required this visit?  No     Plan:     I have personally reviewed and noted the following in the patient's chart:   Medical and social history Use of alcohol, tobacco or illicit drugs  Current medications and supplements including opioid prescriptions. Patient is not currently taking opioid prescriptions. Functional ability and status Nutritional status Physical activity Advanced directives List of other physicians Hospitalizations, surgeries, and ER visits in previous 12 months Vitals Screenings to include cognitive, depression, and falls Referrals and appointments  In addition, I have reviewed and discussed with patient certain preventive protocols, quality metrics, and best practice recommendations. A written personalized care plan for preventive services as well as general preventive health recommendations were provided to patient.     Marzella Schlein, LPN   [id number]/44/[id number]   After Visit Summary: (MyChart) Due to this being a telephonic visit, the after visit  summary with patients personalized plan was offered to patient via MyChart   Nurse Notes: none

## 2022-10-29 ENCOUNTER — Other Ambulatory Visit: Payer: Self-pay

## 2022-10-29 ENCOUNTER — Ambulatory Visit (INDEPENDENT_AMBULATORY_CARE_PROVIDER_SITE_OTHER): Payer: Medicare HMO

## 2022-10-29 ENCOUNTER — Encounter: Payer: Self-pay | Admitting: Family Medicine

## 2022-10-29 ENCOUNTER — Ambulatory Visit: Payer: Medicare HMO | Admitting: Family Medicine

## 2022-10-29 VITALS — BP 132/86 | HR 70 | Ht 64.0 in | Wt 233.6 lb

## 2022-10-29 DIAGNOSIS — G5601 Carpal tunnel syndrome, right upper limb: Secondary | ICD-10-CM | POA: Diagnosis not present

## 2022-10-29 DIAGNOSIS — M9901 Segmental and somatic dysfunction of cervical region: Secondary | ICD-10-CM | POA: Diagnosis not present

## 2022-10-29 DIAGNOSIS — M9903 Segmental and somatic dysfunction of lumbar region: Secondary | ICD-10-CM | POA: Diagnosis not present

## 2022-10-29 DIAGNOSIS — M9905 Segmental and somatic dysfunction of pelvic region: Secondary | ICD-10-CM | POA: Diagnosis not present

## 2022-10-29 DIAGNOSIS — M9904 Segmental and somatic dysfunction of sacral region: Secondary | ICD-10-CM | POA: Diagnosis not present

## 2022-10-29 DIAGNOSIS — M25531 Pain in right wrist: Secondary | ICD-10-CM

## 2022-10-29 DIAGNOSIS — M1811 Unilateral primary osteoarthritis of first carpometacarpal joint, right hand: Secondary | ICD-10-CM | POA: Diagnosis not present

## 2022-10-29 MED ORDER — GABAPENTIN 100 MG PO CAPS
100.0000 mg | ORAL_CAPSULE | Freq: Three times a day (TID) | ORAL | 3 refills | Status: DC | PRN
Start: 2022-10-29 — End: 2023-10-19

## 2022-10-29 NOTE — Progress Notes (Signed)
I, Stevenson Clinch, CMA acting as a scribe for [name], MD.  Sherri Smith is a 77 y.o. female who presents to [facility] today for bilat hand pain. Pt was last seen by Dr. Denyse Amass on 02/16/22. Last R CT steroid injection was on 01/08/22.  Today, pt reports significant improvement of carpal tunnel in the RIGHT hand s/p steriod injection. Larey Seat the beginning of June, down a flight of stairs, notes increased pain in both hands since then. Had soreness and stiffness in the hand for a couple of weeks and then n/t started to return.   Radiates: wrist and hand Paresthesia: yes Grip strength: yes  Dx imaging: 01/19/22 R & L hand XR   Pertinent review of systems: No fevers or chills  Relevant historical information: Sleep apnea.  History right carpal tunnel syndrome.   Exam:  BP 132/86   Pulse 70   Ht 5\' 4"  (1.626 m)   Wt 233 lb 9.6 oz (106 kg)   SpO2 97%   BMI 40.10 kg/m  General: Well Developed, well nourished, and in no acute distress.   MSK: Right hand and wrist is normal-appearing Normal motion. Tender palpation mildly at ulnar wrist and hand proximal fifth metacarpal and at carpal tunnel.  Positive Tinel's carpal tunnel.  Grip strength is intact.    Lab and Radiology Results  Procedure: Real-time Ultrasound Guided hydrodissection of median nerve right carpal tunnel Device: Philips Affiniti 50G/GE Logiq Images permanently stored and available for review in PACS Verbal informed consent obtained.  Discussed risks and benefits of procedure. Warned about infection, bleeding, hyperglycemia damage to structures among others. Patient expresses understanding and agreement Time-out conducted.   Noted no overlying erythema, induration, or other signs of local infection.   Skin prepped in a sterile fashion.   Local anesthesia: Topical Ethyl chloride.   With sterile technique and under real time ultrasound guidance: 40 mg of Kenalog and 1 mL of  lidocaine injected into carpal tunnel around the right median nerve. Fluid seen entering the carpal tunnel.   Completed without difficulty   Pain immediately resolved suggesting accurate placement of the medication.   Advised to call if fevers/chills, erythema, induration, drainage, or persistent bleeding.   Images permanently stored and available for review in the ultrasound unit.  Impression: Technically successful ultrasound guided injection.    X-ray images right wrist obtained today personally and independently interpreted No acute fractures.  Degenerative changes present. Await formal radiology review     Assessment and Plan: 77 y.o. female with exacerbation right carpal tunnel syndrome after a fall.  She was seen for this issue in October of last year and had a carpal tunnel injection which worked until recently.  Plan for steroid injection today and continued carpal tunnel wrist brace.  Use gabapentin as needed.  Talked about safe dosing of meloxicam.   PDMP not reviewed this encounter. Orders Placed This Encounter  Procedures   [place] LIMITED JOINT SPACE STRUCTURES UP RIGHT(NO LINKED CHARGES)    Order Specific Question:   Reason for Exam (SYMPTOM  OR DIAGNOSIS REQUIRED)    Answer:   right hand/wrist pain    Order Specific Question:   Preferred imaging location?    Answer:   [facility] Wrist Complete Right    Standing Status:   Future    Number of Occurrences:   1    Standing Expiration Date:   10/29/2023    Order Specific Question:  Reason for Exam (SYMPTOM  OR DIAGNOSIS REQUIRED)    Answer:   eval wrist pain after fall    Order Specific Question:   Preferred imaging location?    Answer:   [facility]   Meds ordered this encounter  Medications   gabapentin (NEURONTIN) 100 MG capsule    Sig: Take 1-3 capsules (100-300 mg total) by mouth 3 (three) times daily as needed.    Dispense:  90 capsule    Refill:  3     Discussed warning  signs or symptoms. Please see discharge instructions. Patient expresses understanding.   The above documentation has been reviewed and is accurate and complete [name], M.D.

## 2022-10-29 NOTE — Patient Instructions (Addendum)
Thank you for coming in today.   Please get an Xray today before you leave   Call or go to the ER if you develop a large red swollen joint with extreme pain or oozing puss.    Ok to use meloxicam  and gabapentin as needed.

## 2022-10-30 NOTE — Progress Notes (Signed)
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  No fractures are visible.

## 2022-11-02 ENCOUNTER — Ambulatory Visit (HOSPITAL_BASED_OUTPATIENT_CLINIC_OR_DEPARTMENT_OTHER): Payer: Medicare HMO | Admitting: Physical Therapy

## 2022-11-04 ENCOUNTER — Ambulatory Visit (HOSPITAL_BASED_OUTPATIENT_CLINIC_OR_DEPARTMENT_OTHER): Payer: Medicare HMO | Admitting: Physical Therapy

## 2022-11-12 DIAGNOSIS — M9905 Segmental and somatic dysfunction of pelvic region: Secondary | ICD-10-CM | POA: Diagnosis not present

## 2022-11-12 DIAGNOSIS — M9901 Segmental and somatic dysfunction of cervical region: Secondary | ICD-10-CM | POA: Diagnosis not present

## 2022-11-12 DIAGNOSIS — M9904 Segmental and somatic dysfunction of sacral region: Secondary | ICD-10-CM | POA: Diagnosis not present

## 2022-11-12 DIAGNOSIS — M9903 Segmental and somatic dysfunction of lumbar region: Secondary | ICD-10-CM | POA: Diagnosis not present

## 2022-11-19 ENCOUNTER — Ambulatory Visit (INDEPENDENT_AMBULATORY_CARE_PROVIDER_SITE_OTHER): Payer: Medicare HMO | Admitting: Physician Assistant

## 2022-11-19 ENCOUNTER — Encounter: Payer: Self-pay | Admitting: Physician Assistant

## 2022-11-19 VITALS — BP 110/70 | HR 61 | Temp 97.3°F | Ht 64.0 in | Wt 223.0 lb

## 2022-11-19 DIAGNOSIS — R7989 Other specified abnormal findings of blood chemistry: Secondary | ICD-10-CM

## 2022-11-19 DIAGNOSIS — R194 Change in bowel habit: Secondary | ICD-10-CM

## 2022-11-19 NOTE — Progress Notes (Signed)
Sherri Smith is a 77 y.o. female here for a new problem.  History of Present Illness:   Chief Complaint  Patient presents with   GI Problem    Pt c/o constipation and diarrhea, explosive diarrhea about a month ago, has been off and on. Has been managing it with diet.    HPI  Constipation/ Diarrhea  She complains today of on/off alternating constipation and diarrhea that has started about 5 months ago. She has been dealing with plenty of stress and wonders if that had triggered her symptoms. She reports having an episode of explosive diarrhea accompanied by fecal incontinence that occurred about a month ago. Her episode lasted for about 3 days. She denies any blood in stool and states that her stool is watery at times and solid at other times. She's also experiencing accompanying abdominal gurgling.  She has been trying to mange her symptoms with her diet and reports that she stopped any caffeine intake as that tends to worsen her symptoms.  She states that she has stopped incorporating milk with her coffee several years back.   She has changed her eating habits. She has her breakfast around 10-11 am and has a lighter dinner, cutting sugar, and avoiding meals before bedtime. She would occasionally fast every now and then.  She also reports losing about 10 pounds.  She also tried Imodium which also helped during the acute episode of diarrhea. She reports that she is taking Magnesium l-threonate daily for about a year.   She is also going to be starting a daily probiotic today.  She states that over the past year, her bowel habits has changed however, she states that her symptoms have improved since she made several lifestyle and dietary changes.  She states that she typically doesn't eat plenty of grains or pasta but enjoys potatoes and cuscus.   She is not UpToDate on colon cancer screening.   Abnormal CBC She was found to have abnormal absolute lymphocytes on last blood draw 1  month ago She is due for recheck of this   Past Medical History:  Diagnosis Date   Hyperlipidemia      Social History   Tobacco Use   Smoking status: Never   Smokeless tobacco: Never  Vaping Use   Vaping status: Never Used  Substance Use Topics   Alcohol use: Yes    Alcohol/week: 4.0 standard drinks of alcohol    Types: 4 Glasses of wine per week    Comment: one glass 5-6x/week   Drug use: Never    Past Surgical History:  Procedure Laterality Date   APPENDECTOMY     OVARIAN CYST REMOVAL     1972    Family History  Problem Relation Age of Onset   Breast cancer Mother    Osteoarthritis Mother    Depression Mother    Heart disease Mother    Prostate cancer Father    Osteoarthritis Father    Hyperlipidemia Father    Hypertension Father    Heart disease Maternal Grandmother    Heart disease Maternal Grandfather     No Known Allergies  Current Medications:   Current Outpatient Medications:    atorvastatin (LIPITOR) 20 MG tablet, TAKE 1 TABLET(20 MG) BY MOUTH DAILY, Disp: 90 tablet, Rfl: 2   B Complex Vitamins (B COMPLEX-B12) TABS, Take 2 tablets by mouth daily in the afternoon., Disp: , Rfl:    furosemide (LASIX) 20 MG tablet, Take 1 tablet (20 mg total) by mouth daily as  needed for edema., Disp: 30 tablet, Rfl: 1   gabapentin (NEURONTIN) 100 MG capsule, Take 1-3 capsules (100-300 mg total) by mouth 3 (three) times daily as needed., Disp: 90 capsule, Rfl: 3   Omega-3 Fatty Acids (FISH OIL) 1000 MG CAPS, Take 1 capsule by mouth daily in the afternoon., Disp: , Rfl:    OVER THE COUNTER MEDICATION, Take 2,000 mg by mouth 3 (three) times daily. Magnesium L- Threonate, Disp: , Rfl:    Probiotic Product (PROBIOTIC DAILY PO), Take 1 capsule by mouth daily in the afternoon., Disp: , Rfl:    Turmeric (QC TUMERIC COMPLEX) 500 MG CAPS, Take 1 capsule by mouth daily in the afternoon., Disp: , Rfl:    VITAMIN D PO, Take 125 mcg by mouth. K 2 included, Disp: , Rfl:    Zinc 100  MG TABS, Take 1 tablet by mouth daily in the afternoon., Disp: , Rfl:    Review of Systems:   Review of Systems  Gastrointestinal:  Positive for constipation and diarrhea.  Genitourinary:        +Urinary incontinence     Vitals:   Vitals:   11/19/22 0954  BP: 110/70  Pulse: 61  Temp: (!) 97.3 F (36.3 C)  TempSrc: Temporal  SpO2: 98%  Weight: 223 lb (101.2 kg)  Height: 5\' 4"  (1.626 m)     Body mass index is 38.28 kg/m.  Physical Exam:   Physical Exam Constitutional:      Appearance: Normal appearance. She is well-developed.  HENT:     Head: Normocephalic and atraumatic.  Eyes:     General: Lids are normal.     Extraocular Movements: Extraocular movements intact.     Conjunctiva/sclera: Conjunctivae normal.  Pulmonary:     Effort: Pulmonary effort is normal.  Abdominal:     General: Abdomen is flat. Bowel sounds are normal.     Palpations: Abdomen is soft.     Tenderness: There is no abdominal tenderness.  Musculoskeletal:        General: Normal range of motion.     Cervical back: Normal range of motion and neck supple.  Skin:    General: Skin is warm and dry.  Neurological:     Mental Status: She is alert and oriented to person, place, and time.  Psychiatric:        Attention and Perception: Attention and perception normal.        Mood and Affect: Mood normal.        Behavior: Behavior normal.        Thought Content: Thought content normal.        Judgment: Judgment normal.     Assessment and Plan:   Abnormal CBC Recheck today and add smear Follow-up based on results  Bowel habit changes Unclear etiology Gastroenterology referral placed today Will order ifob as she is currently not UpToDate on colorectal screening  Recommend 1-2 tablespoons benefiber daily to help bulk up stools Consider low fod-map diet Hold magnesium supplement in order to make sure that this is not a possible side effect of this medication   I,Safa M Kadhim,acting as a  scribe for [employer], PA.,have documented all relevant documentation on the behalf of Jarold Motto, PA,as directed by  Jarold Motto, PA while in the presence of Jarold Motto, [place].   I, Jarold Motto, [place], have reviewed all documentation for this visit. The documentation on 11/19/22 for the exam, diagnosis, procedures, and orders are all accurate and complete.   Lelon Mast  Bufford Buttner, PA-C

## 2022-11-19 NOTE — Patient Instructions (Signed)
It was great to see you!  Gastroenterology referral Update blood work Stool card Consider low fod-map diet Hold magnesium supplement to rule out side effects of this  Start 1-2 tablespoons of benefiber today to bulk up stools      Take care,  Jarold Motto PA-C

## 2022-11-20 LAB — CBC WITH DIFFERENTIAL/PLATELET
Absolute Monocytes: 442 {cells}/uL (ref 200–950)
Basophils Absolute: 47 {cells}/uL (ref 0–200)
Basophils Relative: 0.6 %
Eosinophils Absolute: 190 cells/uL (ref 15–500)
Eosinophils Relative: 2.4 %
HCT: 44.4 % (ref 35.0–45.0)
Hemoglobin: 14.9 g/dL (ref 11.7–15.5)
Lymphs Abs: 3350 {cells}/uL (ref 850–3900)
MCH: 29.6 pg (ref 27.0–33.0)
MCHC: 33.6 g/dL (ref 32.0–36.0)
MCV: 88.1 fL (ref 80.0–100.0)
MPV: 10.6 fL (ref 7.5–12.5)
Monocytes Relative: 5.6 %
Neutro Abs: 3871 {cells}/uL (ref 1500–7800)
Neutrophils Relative %: 49 %
Platelets: 281 10*3/uL (ref 140–400)
RBC: 5.04 10*6/uL (ref 3.80–5.10)
RDW: 13.2 % (ref 11.0–15.0)
Total Lymphocyte: 42.4 %
WBC: 7.9 10*3/uL (ref 3.8–10.8)

## 2022-11-20 LAB — PATHOLOGIST SMEAR REVIEW

## 2022-11-25 ENCOUNTER — Other Ambulatory Visit: Payer: Medicare HMO

## 2022-11-26 ENCOUNTER — Other Ambulatory Visit (INDEPENDENT_AMBULATORY_CARE_PROVIDER_SITE_OTHER): Payer: Medicare HMO

## 2022-11-26 DIAGNOSIS — R194 Change in bowel habit: Secondary | ICD-10-CM | POA: Diagnosis not present

## 2022-11-26 DIAGNOSIS — M9904 Segmental and somatic dysfunction of sacral region: Secondary | ICD-10-CM | POA: Diagnosis not present

## 2022-11-26 DIAGNOSIS — M9903 Segmental and somatic dysfunction of lumbar region: Secondary | ICD-10-CM | POA: Diagnosis not present

## 2022-11-26 DIAGNOSIS — M9905 Segmental and somatic dysfunction of pelvic region: Secondary | ICD-10-CM | POA: Diagnosis not present

## 2022-11-26 DIAGNOSIS — M9901 Segmental and somatic dysfunction of cervical region: Secondary | ICD-10-CM | POA: Diagnosis not present

## 2022-11-26 LAB — FECAL OCCULT BLOOD, IMMUNOCHEMICAL: Fecal Occult Bld: NEGATIVE

## 2022-11-27 ENCOUNTER — Encounter: Payer: Self-pay | Admitting: Internal Medicine

## 2022-11-27 ENCOUNTER — Encounter: Payer: Self-pay | Admitting: Physician Assistant

## 2022-12-10 DIAGNOSIS — M9904 Segmental and somatic dysfunction of sacral region: Secondary | ICD-10-CM | POA: Diagnosis not present

## 2022-12-10 DIAGNOSIS — M9905 Segmental and somatic dysfunction of pelvic region: Secondary | ICD-10-CM | POA: Diagnosis not present

## 2022-12-10 DIAGNOSIS — M9903 Segmental and somatic dysfunction of lumbar region: Secondary | ICD-10-CM | POA: Diagnosis not present

## 2022-12-10 DIAGNOSIS — M9901 Segmental and somatic dysfunction of cervical region: Secondary | ICD-10-CM | POA: Diagnosis not present

## 2022-12-17 DIAGNOSIS — M9901 Segmental and somatic dysfunction of cervical region: Secondary | ICD-10-CM | POA: Diagnosis not present

## 2022-12-17 DIAGNOSIS — M9905 Segmental and somatic dysfunction of pelvic region: Secondary | ICD-10-CM | POA: Diagnosis not present

## 2022-12-17 DIAGNOSIS — M9903 Segmental and somatic dysfunction of lumbar region: Secondary | ICD-10-CM | POA: Diagnosis not present

## 2022-12-17 DIAGNOSIS — M9904 Segmental and somatic dysfunction of sacral region: Secondary | ICD-10-CM | POA: Diagnosis not present

## 2022-12-22 ENCOUNTER — Ambulatory Visit (HOSPITAL_BASED_OUTPATIENT_CLINIC_OR_DEPARTMENT_OTHER): Payer: Medicare HMO | Admitting: Cardiology

## 2022-12-24 DIAGNOSIS — M9904 Segmental and somatic dysfunction of sacral region: Secondary | ICD-10-CM | POA: Diagnosis not present

## 2022-12-24 DIAGNOSIS — M9905 Segmental and somatic dysfunction of pelvic region: Secondary | ICD-10-CM | POA: Diagnosis not present

## 2022-12-24 DIAGNOSIS — M9903 Segmental and somatic dysfunction of lumbar region: Secondary | ICD-10-CM | POA: Diagnosis not present

## 2022-12-24 DIAGNOSIS — M9901 Segmental and somatic dysfunction of cervical region: Secondary | ICD-10-CM | POA: Diagnosis not present

## 2023-01-07 DIAGNOSIS — M9903 Segmental and somatic dysfunction of lumbar region: Secondary | ICD-10-CM | POA: Diagnosis not present

## 2023-01-07 DIAGNOSIS — M9904 Segmental and somatic dysfunction of sacral region: Secondary | ICD-10-CM | POA: Diagnosis not present

## 2023-01-07 DIAGNOSIS — M9905 Segmental and somatic dysfunction of pelvic region: Secondary | ICD-10-CM | POA: Diagnosis not present

## 2023-01-07 DIAGNOSIS — M9901 Segmental and somatic dysfunction of cervical region: Secondary | ICD-10-CM | POA: Diagnosis not present

## 2023-01-14 DIAGNOSIS — M9905 Segmental and somatic dysfunction of pelvic region: Secondary | ICD-10-CM | POA: Diagnosis not present

## 2023-01-14 DIAGNOSIS — M9904 Segmental and somatic dysfunction of sacral region: Secondary | ICD-10-CM | POA: Diagnosis not present

## 2023-01-14 DIAGNOSIS — M9901 Segmental and somatic dysfunction of cervical region: Secondary | ICD-10-CM | POA: Diagnosis not present

## 2023-01-14 DIAGNOSIS — M9903 Segmental and somatic dysfunction of lumbar region: Secondary | ICD-10-CM | POA: Diagnosis not present

## 2023-01-21 DIAGNOSIS — M9901 Segmental and somatic dysfunction of cervical region: Secondary | ICD-10-CM | POA: Diagnosis not present

## 2023-01-21 DIAGNOSIS — M9903 Segmental and somatic dysfunction of lumbar region: Secondary | ICD-10-CM | POA: Diagnosis not present

## 2023-01-21 DIAGNOSIS — M9905 Segmental and somatic dysfunction of pelvic region: Secondary | ICD-10-CM | POA: Diagnosis not present

## 2023-01-21 DIAGNOSIS — M9904 Segmental and somatic dysfunction of sacral region: Secondary | ICD-10-CM | POA: Diagnosis not present

## 2023-01-22 ENCOUNTER — Ambulatory Visit (HOSPITAL_BASED_OUTPATIENT_CLINIC_OR_DEPARTMENT_OTHER): Payer: Medicare HMO | Admitting: Cardiology

## 2023-01-22 ENCOUNTER — Encounter (HOSPITAL_BASED_OUTPATIENT_CLINIC_OR_DEPARTMENT_OTHER): Payer: Self-pay | Admitting: Cardiology

## 2023-01-22 VITALS — BP 126/86 | HR 60 | Ht 64.0 in | Wt 221.0 lb

## 2023-01-22 DIAGNOSIS — I2510 Atherosclerotic heart disease of native coronary artery without angina pectoris: Secondary | ICD-10-CM | POA: Diagnosis not present

## 2023-01-22 DIAGNOSIS — R600 Localized edema: Secondary | ICD-10-CM

## 2023-01-22 DIAGNOSIS — Z712 Person consulting for explanation of examination or test findings: Secondary | ICD-10-CM | POA: Diagnosis not present

## 2023-01-22 DIAGNOSIS — Z8249 Family history of ischemic heart disease and other diseases of the circulatory system: Secondary | ICD-10-CM

## 2023-01-22 DIAGNOSIS — E782 Mixed hyperlipidemia: Secondary | ICD-10-CM | POA: Diagnosis not present

## 2023-01-22 NOTE — Patient Instructions (Addendum)
Medication Instructions:  Your physician recommends that you continue on your current medications as directed. Please refer to the Current Medication list given to you today.  *If you need a refill on your cardiac medications before your next appointment, please call your pharmacy*  Lab Work: Lipid panel and LFT in 3 months.  Follow-Up: At [facility], you and your health needs are our priority.  As part of our continuing mission to provide you with exceptional heart care, we have created designated Provider Care Teams.  These Care Teams include your primary Cardiologist (physician) and Advanced Practice Providers (APPs -  Physician Assistants and Nurse Practitioners) who all work together to provide you with the care you need, when you need it.  We recommend signing up for the patient portal called "MyChart".  Sign up information is provided on this After Visit Summary.  MyChart is used to connect with patients for Virtual Visits (Telemedicine).  Patients are able to view lab/test results, encounter notes, upcoming appointments, etc.  Non-urgent messages can be sent to your provider as well.   To learn more about what you can do with MyChart, go to [email or web address].    Your next appointment:   Follow up in 1 year with Dr. Cristal Deer or Gillian Shields, NP  Other Instructions Recent data, summarized from NPR from a study from the [facility]:  CropWizard.com.pt  "Researchers at the [facility] set out to answer this question by comparing statins to supplements in a clinical trial. They tracked the outcomes of 190 adults, ages 65 to 19. Some participants were given a 5 mg daily dose of rosuvastatin, a statin that is sold under the brand name Crestor for 28 days. Others were given supplements, including fish oil, cinnamon, garlic, turmeric,  plant sterols or red yeast rice for the same period."  "What we found was that rosuvastatin lowered LDL cholesterol by almost 40% and that was vastly superior to placebo and any of the six supplements studied in the trial," study Kayren Eaves, M.D. of the [facility] told NPR. He says this level of reduction is enough to lower the risk of heart attacks and strokes. The findings are published in the Journal of the [employer] of Cardiology.  "Oftentimes these supplements are marketed as 'natural ways' to lower your cholesterol," says Laffin. But he says none of the dietary supplements demonstrated any significant decrease in LDL cholesterol compared with a placebo. LDL cholesterol is considered the 'bad cholesterol' because it can contribute to plaque build-up in the artery walls - which can narrow the arteries, and set the stage for heart attacks and strokes"  ------------------------------------------------------------------------------------------------------------------------------------- What is the Mediterranean diet?  The Mediterranean-style diet has fewer meats and carbohydrates than a typical American diet. It also has more plant-based foods and monounsaturated (good) fat. People who live in [place], [place], and other countries in the Mediterranean region have eaten this way for centuries.  Following the Mediterranean diet may lead to more stable blood sugar, lower cholesterol and triglycerides, and a lower risk for heart disease and other health problems.  How to Follow the Diet   The Mediterranean diet is based on:   Plant-based meals, with just small amounts of lean meat and chicken  More servings of whole grains, fresh fruits and vegetables, nuts, and legumes  Foods that naturally contain high amounts of fiber  Plenty of fish and other seafood  Olive oil as the main source of fat for preparing  food. Olive oil is a healthy,  monounsaturated fat  Food that is prepared and seasoned simply, without sauces and gravies  Foods Not in the Diet  Foods that are eaten in small amounts or NOT at all in the Mediterranean diet include:   Red meats  Sweets and other desserts  Eggs  Butter  Possible Health Concerns  There may be health concerns with this eating style for some people, including:   You may gain weight from eating fats in olive oil and nuts.  You may have lower levels of iron. If you choose to follow the Mediterranean diet, be sure to eat some foods rich in iron or in vitamin C, which helps your body absorb iron.  You may have calcium loss from eating fewer dairy products. Ask your health care provider if you should take a calcium supplement.  Wine is a common part of a Mediterranean eating style but some people should not drink alcohol. Avoid wine if you are prone to alcohol abuse, pregnant, at risk for breast cancer, or have other conditions that alcohol could make worse.  References  [facility], Jakicic Harolyn Rutherford, et al. 2013 AHA/ACC guideline on lifestyle management to reduce cardiovascular risk: a report of the [employer] of Cardiology/American Heart Association Task Force on practice guidelines. J Am Coll Cardiol. 1610;96(04 Pt B):2960-2984. PMID: [id number] WikiTutors.com.ee.  Mozaffarian D. Nutrition and cardiovascular and metabolic diseases. In: [place], [place] DP, [address] [place], [place], [place]. Braunwald's Heart Disease: A Textbook of Cardiovascular Medicine. 10th ed. [place], [place]: [employer]; 2015:chap 46.  Copied from: MediaSuits.se

## 2023-01-22 NOTE — Progress Notes (Signed)
Cardiology Office Note:  .    Date:  01/22/2023  ID:  Sherri Smith, DOB 05-Jun-1945, MRN [id number] PCP: Jarold Motto, PA  [facility] Providers Cardiologist:  Jodelle Red, MD     History of Present Illness: .    Sherri Smith is a 77 y.o. female with a hx of CAD, aortic atherosclerosis, hyperlipidemia, OSA on CPAP, obesity, carpal tunnel syndrome, here for the evaluation of LE edema at the request of Jarold Motto, [place]. She saw her PCP 09/30/2022 and complained of constant leg swelling, worse in the left but no prior leg injuries. She elevates her legs at home and was active with walking and weight training. Noted mild shortness of breath. It was suspected her leg swelling was dependent edema and she was started on Lasix 20 mg as needed. Per patient request she was referred to cardiology for further evaluation.  A few years ago she had chest pain and was evaluated in the ED. Since then, she has had 2 recurrent episodes of chest pain that were associated with left shoulder blade pain, central chest pain, and mild headache. Both of these episodes lasted less than an hour before resolving on their own, and both occurred while she was just sitting. Her most recent episode was a couple weeks ago. She did not seek ED evaluation. She confirms that she was experiencing significant LE edema recently, normally in her left leg and ankle. She initially took Lasix twice which helped. She hasn't needed to take the Lasix recently.  Cardiovascular risk factors: Prior clinical ASCVD: Coronary CT 01/2022 revealed coronary calcium score 126 and aortic atherosclerosis. Comorbid conditions: Hyperlipidemia - Previously on atorvastatin 20 mg daily. She self-discontinued her atorvastatin as she read alarming information about the medication. We discussed this and her calcium score in detail. Lipid panel 09/2022 showed LDL 59, elevated triglycerides at 187 in the setting of not fasting. Metabolic  syndrome/Obesity:  Current weight 221 lbs. She continues to successfully work on weight loss.  Chronic inflammatory conditions:  None. Tobacco use history:  Never. Family history: Heart disease in parents and grandparents. Mother had pacemaker. Father had multiple heart attacks. Prior pertinent testing and/or incidental findings: Calcium score as above. Exercise level: Walking, weight training. Walks at least 20 minutes daily, up to 35-40 minutes. Stays busy and active with shopping, cooking, cleaning.  Current diet: She has been following a modified intermittent fasting diet. She has stopped alcohol consumption, previously had a glass of wine most nights. She is also focusing on low-cholesterol meals.   She denies any palpitations, shortness of breath, lightheadedness, headaches, syncope, orthopnea, or PND.  ROS:  Please see the history of present illness. ROS otherwise negative except as noted.   Studies Reviewed: Marland Kitchen    EKG Interpretation Date/Time:  Friday January 22 2023 09:41:06 EDT Ventricular Rate:  60 PR Interval:  174 QRS Duration:  76 QT Interval:  440 QTC Calculation: 440 R Axis:   30  Text Interpretation: Normal sinus rhythm Low voltage QRS Confirmed by Jodelle Red [phone]) on 01/22/2023 9:43:43 AM    CT Cardiac Scoring  02/23/2022: IMPRESSION: 1. Coronary calcium score of 126. This was 66th percentile for age-, race-, and sex-matched controls. 2. Aortic atherosclerosis.   Physical Exam:    VS:  BP 126/86   Pulse 60   Ht 5\' 4"  (1.626 m)   Wt 221 lb (100.2 kg)   SpO2 99%   BMI 37.93 kg/m    Wt Readings from Last 3 Encounters:  01/22/23 221 lb (100.2 kg)  11/19/22 223 lb (101.2 kg)  10/29/22 233 lb 9.6 oz (106 kg)    GEN: Well nourished, well developed in no acute distress HEENT: Normal, moist mucous membranes NECK: No JVD CARDIAC: regular rhythm, normal S1 and S2, no rubs or gallops. No murmur. VASCULAR: Radial and DP pulses 2+ bilaterally. No  carotid bruits RESPIRATORY:  Clear to auscultation without rales, wheezing or rhonchi  ABDOMEN: Soft, non-tender, non-distended MUSCULOSKELETAL:  Ambulates independently SKIN: Warm and dry, no edema NEUROLOGIC:  Alert and oriented x 3. No focal neuro deficits noted. PSYCHIATRIC:  Normal affect   ASSESSMENT AND PLAN: .    Coronary calcification on CT, consistent with nonobstructive CAD Mixed hyperlipidemia Family history of heart disease -We reviewed the calcium score at length, including images as well as the graph showing mortality based on calcium score. We discussed the pathophysiology of cholesterol plaque formation, the role of calcium and why it is a marker, how plaque is key to acute MI/CVA, and how known plaque is managed with medications.   -we discussed the data on statins, both in terms of their long term benefit as well as the risk of side effects. Reviewed common misconceptions about statins. Reviewed how we monitor treatment.  -reviewed red flag warning signs that need immediate medical attention  -Recommend she resume her atorvastatin 20 mg. Recheck fasting lipids in 3 months.  Bilateral LE edema: intermittent, rare, none present today. Continue to monitor, get echo if returns/does not resolve  CV risk counseling and prevention -recommend heart healthy/Mediterranean diet, with whole grains, fruits, vegetable, fish, lean meats, nuts, and olive oil. Limit salt. -recommend moderate walking, 3-5 times/week for 30-50 minutes each session. Aim for at least 150 minutes.week. Goal should be pace of 3 miles/hours, or walking 1.5 miles in 30 minutes -recommend avoidance of tobacco products. Avoid excess alcohol.  Dispo: Follow-up in 1 year, or sooner as needed.  I,Mathew Stumpf,acting as a Neurosurgeon for [employer], MD.,have documented all relevant documentation on the behalf of Jodelle Red, MD,as directed by  Jodelle Red, MD while in the presence of  Jodelle Red, MD.  I, Jodelle Red, MD, have reviewed all documentation for this visit. The documentation on 01/22/23 for the exam, diagnosis, procedures, and orders are all accurate and complete.   Signed, Jodelle Red, MD

## 2023-01-28 DIAGNOSIS — M9904 Segmental and somatic dysfunction of sacral region: Secondary | ICD-10-CM | POA: Diagnosis not present

## 2023-01-28 DIAGNOSIS — M9905 Segmental and somatic dysfunction of pelvic region: Secondary | ICD-10-CM | POA: Diagnosis not present

## 2023-01-28 DIAGNOSIS — M9903 Segmental and somatic dysfunction of lumbar region: Secondary | ICD-10-CM | POA: Diagnosis not present

## 2023-01-28 DIAGNOSIS — M9901 Segmental and somatic dysfunction of cervical region: Secondary | ICD-10-CM | POA: Diagnosis not present

## 2023-02-04 DIAGNOSIS — M9905 Segmental and somatic dysfunction of pelvic region: Secondary | ICD-10-CM | POA: Diagnosis not present

## 2023-02-04 DIAGNOSIS — M9903 Segmental and somatic dysfunction of lumbar region: Secondary | ICD-10-CM | POA: Diagnosis not present

## 2023-02-04 DIAGNOSIS — M9904 Segmental and somatic dysfunction of sacral region: Secondary | ICD-10-CM | POA: Diagnosis not present

## 2023-02-04 DIAGNOSIS — M9901 Segmental and somatic dysfunction of cervical region: Secondary | ICD-10-CM | POA: Diagnosis not present

## 2023-02-05 ENCOUNTER — Other Ambulatory Visit: Payer: Self-pay | Admitting: Medical Genetics

## 2023-02-05 DIAGNOSIS — Z006 Encounter for examination for normal comparison and control in clinical research program: Secondary | ICD-10-CM

## 2023-02-11 DIAGNOSIS — M9904 Segmental and somatic dysfunction of sacral region: Secondary | ICD-10-CM | POA: Diagnosis not present

## 2023-02-11 DIAGNOSIS — H25013 Cortical age-related cataract, bilateral: Secondary | ICD-10-CM | POA: Diagnosis not present

## 2023-02-11 DIAGNOSIS — H04123 Dry eye syndrome of bilateral lacrimal glands: Secondary | ICD-10-CM | POA: Diagnosis not present

## 2023-02-11 DIAGNOSIS — M9901 Segmental and somatic dysfunction of cervical region: Secondary | ICD-10-CM | POA: Diagnosis not present

## 2023-02-11 DIAGNOSIS — M9903 Segmental and somatic dysfunction of lumbar region: Secondary | ICD-10-CM | POA: Diagnosis not present

## 2023-02-11 DIAGNOSIS — H5203 Hypermetropia, bilateral: Secondary | ICD-10-CM | POA: Diagnosis not present

## 2023-02-11 DIAGNOSIS — M9905 Segmental and somatic dysfunction of pelvic region: Secondary | ICD-10-CM | POA: Diagnosis not present

## 2023-02-11 DIAGNOSIS — H524 Presbyopia: Secondary | ICD-10-CM | POA: Diagnosis not present

## 2023-02-11 DIAGNOSIS — H5712 Ocular pain, left eye: Secondary | ICD-10-CM | POA: Diagnosis not present

## 2023-02-11 DIAGNOSIS — H52223 Regular astigmatism, bilateral: Secondary | ICD-10-CM | POA: Diagnosis not present

## 2023-03-03 ENCOUNTER — Other Ambulatory Visit (HOSPITAL_COMMUNITY)
Admission: RE | Admit: 2023-03-03 | Discharge: 2023-03-03 | Disposition: A | Discharge status: Home / Self Care | Payer: Medicare HMO | Source: Ambulatory Visit | Attending: Oncology | Admitting: Oncology

## 2023-03-03 DIAGNOSIS — Z006 Encounter for examination for normal comparison and control in clinical research program: Secondary | ICD-10-CM | POA: Insufficient documentation

## 2023-03-04 DIAGNOSIS — M9903 Segmental and somatic dysfunction of lumbar region: Secondary | ICD-10-CM | POA: Diagnosis not present

## 2023-03-04 DIAGNOSIS — M9905 Segmental and somatic dysfunction of pelvic region: Secondary | ICD-10-CM | POA: Diagnosis not present

## 2023-03-04 DIAGNOSIS — M9901 Segmental and somatic dysfunction of cervical region: Secondary | ICD-10-CM | POA: Diagnosis not present

## 2023-03-04 DIAGNOSIS — M9904 Segmental and somatic dysfunction of sacral region: Secondary | ICD-10-CM | POA: Diagnosis not present

## 2023-03-09 LAB — GENECONNECT MOLECULAR SCREEN: Genetic Analysis Overall Interpretation: NEGATIVE

## 2023-03-11 ENCOUNTER — Ambulatory Visit: Payer: Medicare HMO | Admitting: Internal Medicine

## 2023-03-11 ENCOUNTER — Other Ambulatory Visit (INDEPENDENT_AMBULATORY_CARE_PROVIDER_SITE_OTHER): Payer: Medicare HMO

## 2023-03-11 ENCOUNTER — Encounter: Payer: Self-pay | Admitting: Internal Medicine

## 2023-03-11 VITALS — BP 122/70 | HR 68 | Wt 224.6 lb

## 2023-03-11 DIAGNOSIS — R194 Change in bowel habit: Secondary | ICD-10-CM

## 2023-03-11 DIAGNOSIS — R152 Fecal urgency: Secondary | ICD-10-CM

## 2023-03-11 DIAGNOSIS — Z1211 Encounter for screening for malignant neoplasm of colon: Secondary | ICD-10-CM

## 2023-03-11 DIAGNOSIS — M9905 Segmental and somatic dysfunction of pelvic region: Secondary | ICD-10-CM | POA: Diagnosis not present

## 2023-03-11 DIAGNOSIS — M9903 Segmental and somatic dysfunction of lumbar region: Secondary | ICD-10-CM | POA: Diagnosis not present

## 2023-03-11 DIAGNOSIS — M9904 Segmental and somatic dysfunction of sacral region: Secondary | ICD-10-CM | POA: Diagnosis not present

## 2023-03-11 DIAGNOSIS — M9901 Segmental and somatic dysfunction of cervical region: Secondary | ICD-10-CM | POA: Diagnosis not present

## 2023-03-11 LAB — C-REACTIVE PROTEIN: CRP: <1 mg/dL (ref 0.5–20.0)

## 2023-03-11 MED ORDER — NA SULFATE-K SULFATE-MG SULF 17.5-3.13-1.6 GM/177ML PO SOLN: ORAL | 0 refills | Status: DC

## 2023-03-11 NOTE — Patient Instructions (Addendum)
Your provider has requested that you go to the basement level for lab work before leaving today. Press "B" on the elevator. The lab is located at the first door on the left as you exit the elevator.  You have been scheduled for a colonoscopy. Please follow written instructions given to you at your visit today.   Please pick up your prep supplies at the pharmacy within the next 1-3 days.  If you use inhalers (even only as needed), please bring them with you on the day of your procedure.  DO NOT TAKE 7 DAYS PRIOR TO TEST- Trulicity (dulaglutide) Ozempic, Wegovy (semaglutide) Mounjaro (tirzepatide) Bydureon Bcise (exanatide extended release)  DO NOT TAKE 1 DAY PRIOR TO YOUR TEST Rybelsus (semaglutide) Adlyxin (lixisenatide) Victoza (liraglutide) Byetta (exanatide) ___________________________________________________________________________  We have sent the following medications to your pharmacy for you to pick up at your convenience: Suprep  Start taking Citrucel daily   If your blood pressure at your visit was 140/90 or greater, please contact your primary care physician to follow up on this.  _______________________________________________________  If you are age 18 or older, your body mass index should be between 23-30. Your Body mass index is 38.55 kg/m. If this is out of the aforementioned range listed, please consider follow up with your Primary Care Provider.  If you are age 59 or younger, your body mass index should be between 19-25. Your Body mass index is 38.55 kg/m. If this is out of the aformentioned range listed, please consider follow up with your Primary Care Provider.   ________________________________________________________  The [facility] providers would like to encourage you to use [facility] to communicate with providers for non-urgent requests or questions.  Due to long hold times on the telephone, sending your provider a message by [facility] may be a faster and  more efficient way to get a response.  Please allow 48 business hours for a response.  Please remember that this is for non-urgent requests.  _______________________________________________________  Due to recent changes in healthcare laws, you may see the results of your imaging and laboratory studies on MyChart before your provider has had a chance to review them.  We understand that in some cases there may be results that are confusing or concerning to you. Not all laboratory results come back in the same time frame and the provider may be waiting for multiple results in order to interpret others.  Please give [place] 48 hours in order for your provider to thoroughly review all the results before contacting the office for clarification of your results.   Thank you for entrusting me with your care and for choosing [facility],  Dr. Eulah Pont

## 2023-03-11 NOTE — Progress Notes (Signed)
Chief Complaint: Bowel habit changes  HPI: Patient is a 77 year old female patient with past medical history of CAD, aortic atherosclerosis, hyperlipidemia, OSA on CPAP, obesity, and carpal tunnel syndrome, who was referred to me by Jarold Motto, PA on 11/19/2022 for a complaint of bowel habit changes.  On 11/19/2022 patient was seen by PCP and complaining of altered bowel habits with constipation and diarrhea that started 5 months prior.  Patient stated that she had made lifestyle and dietary changes which did help some.  PCP recommended patient increase overall fiber intake and talked about low FODMAP diet.  She was also taking a magnesium supplement that she recommend she hold for now to see if it helped.   11/26/2022 Fecal occult was ordered and negative.  Interval history:   Patient presents today with main complaint of fecal urgency. She reports  Her symptoms started after she had two episodes of viral illnesses over the course of the past year, one end of 2023 and one beginning of 2024. Prior to these illness she was having one regular bowel movement once to twice per day. During one of these episodes she abruptly woke up in the middle of the night and had to run to the restroom. She used Imodium, followed Brat diet, and cut back on high fiber diet which would help the symptoms until they subsided. She reports after these episodes she Aubriee Szeto have a few days of constipation and she wouldn't go at all. She states she is currently more regular and goes once daily semi formed stools. She will have episodes of intermittent urgency. No rectal incontinence. Denies abdominal pain or rectal bleeding. Denies nausea, vomiting, or weight loss. No known food triggers. No new medications. She stopped her OTC magnesium for 2 weeks to see if it would help. but no improvement. Occasional heartburn, but does not require any medication. She also will clear her throat at times, reports she has seasonal allergies. No  dysphagia. Social drinker. No tobacco use. Last colonoscopy was over 15 years ago and per patient normal. Her mother had breast Ca and her father had prostate cancer.  Wt Readings from Last 3 Encounters:  03/11/23 224 lb 9.6 oz (101.9 kg)  01/22/23 221 lb (100.2 kg)  11/19/22 223 lb (101.2 kg)    Past Medical History:  Diagnosis Date   BMI 40.0-44.9, adult (HCC)    Elevated coronary artery calcium score    Family history of cardiac disorder    Hyperlipidemia    Insomnia    Leg swelling    Past Surgical History:  Procedure Laterality Date   APPENDECTOMY     OVARIAN CYST REMOVAL     1972   TONSILLECTOMY     Current Outpatient Medications  Medication Sig Dispense Refill   atorvastatin (LIPITOR) 20 MG tablet TAKE 1 TABLET(20 MG) BY MOUTH DAILY 90 tablet 2   B Complex Vitamins (B COMPLEX-B12) TABS Take 2 tablets by mouth daily in the afternoon.     Omega-3 Fatty Acids (FISH OIL) 1000 MG CAPS Take 1 capsule by mouth daily in the afternoon.     Probiotic Product (PROBIOTIC DAILY PO) Take 1 capsule by mouth daily in the afternoon.     Turmeric (QC TUMERIC COMPLEX) 500 MG CAPS Take 1 capsule by mouth daily in the afternoon.     VITAMIN D PO Take 125 mcg by mouth. K 2 included     Zinc 100 MG TABS Take 1 tablet by mouth daily in the afternoon.  furosemide (LASIX) 20 MG tablet Take 1 tablet (20 mg total) by mouth daily as needed for edema. (Patient not taking: Reported on 03/11/2023) 30 tablet 1   gabapentin (NEURONTIN) 100 MG capsule Take 1-3 capsules (100-300 mg total) by mouth 3 (three) times daily as needed. (Patient not taking: Reported on 03/11/2023) 90 capsule 3   No current facility-administered medications for this visit.   Allergies as of 03/11/2023   (No Known Allergies)   Family History  Problem Relation Age of Onset   Breast cancer Mother    Osteoarthritis Mother    Depression Mother    Heart disease Mother    Prostate cancer Father    Osteoarthritis Father     Hyperlipidemia Father    Hypertension Father    Heart disease Maternal Grandmother    Heart disease Maternal Grandfather    Colon cancer Neg Hx    Esophageal cancer Neg Hx    Stomach cancer Neg Hx    Review of Systems:    Constitutional: No weight loss, fever, chills, weakness or fatigue HEENT: Eyes: No change in vision               Ears, Nose, Throat:  No change in hearing or congestion Skin: No rash or itching Cardiovascular: No chest pain, chest pressure or palpitations   Respiratory: No SOB or cough Gastrointestinal: See HPI and otherwise negative Genitourinary: No dysuria or change in urinary frequency Neurological: No headache, dizziness or syncope Musculoskeletal: No new muscle or joint pain Hematologic: No bleeding or bruising Psychiatric: No history of depression or anxiety   Physical Exam:  Vital signs: BP 122/70   Pulse 68   Wt 224 lb 9.6 oz (101.9 kg)   SpO2 98%   BMI 38.55 kg/m   Constitutional: Pleasant Caucasian female appears to be in NAD, Well developed, Well nourished, alert and cooperative Throat: Oral cavity and pharynx without inflammation, swelling or lesion.  Respiratory: Respirations even and unlabored. Lungs clear to auscultation bilaterally.   No wheezes, crackles, or rhonchi.  Cardiovascular: Normal S1, S2. Regular rate and rhythm. No peripheral edema, cyanosis or pallor.  Gastrointestinal:  Soft, nondistended, nontender. No rebound or guarding. Normal bowel sounds. No appreciable masses or hepatomegaly. Rectal:  Not performed.  Skin:   Dry and intact without significant lesions or rashes. Psychiatric: Oriented to person, place and time. Demonstrates good judgement and reason without abnormal affect or behaviors.  RELEVANT LABS AND IMAGING: CBC    Latest Ref Rng & Units 11/19/2022   10:46 AM 09/30/2022    3:35 PM 08/26/2021   11:11 AM  CBC  WBC 3.8 - 10.8 Thousand/uL 7.9  10.6  8.4   Hemoglobin 11.7 - 15.5 g/dL 43.3  29.5  18.8   Hematocrit  35.0 - 45.0 % 44.4  40.5  41.6   Platelets 140 - 400 Thousand/uL 281  270  256.0     CMP     Component Value Date/Time   NA 143 09/30/2022 1535   K 4.4 09/30/2022 1535   CL 107 09/30/2022 1535   CO2 28 09/30/2022 1535   GLUCOSE 94 09/30/2022 1535   BUN 16 09/30/2022 1535   CREATININE 0.75 09/30/2022 1535   CALCIUM 9.9 09/30/2022 1535   PROT 7.2 09/30/2022 1535   ALBUMIN 4.7 08/26/2021 1111   AST 27 09/30/2022 1535   ALT 27 09/30/2022 1535   ALKPHOS 53 08/26/2021 1111   BILITOT 0.4 09/30/2022 1535   GFRNONAA >60 11/13/2016 1244   GFRAA >  60 11/13/2016 1244   11/26/2022 fecal occult negative 11/19/2022 labs show: WBC 7.9, hemoglobin 14.9, platelets 281 10/27/2022 labs show: TSH 2.61  Assessment: Encounter Diagnoses  Name Primary?   Altered bowel habits Yes   Special screening for malignant neoplasms, colon    Fecal urgency     Plan: 77 year old female patient that presents with altered bowel habits and bowel urgency over the course of the last 12 mths. She reports the altered bowels have improved some, but continues with bowel urgency. We discussed incorporating fiber into her diet to help bulk the stool as well as checking labs to rule inflammatory process or celiac disease. Her last colonoscopy was over 15 years ago and with the recent bowel changes we recommend colon screening colonoscopy. We could also obtain biopsies to rule out microscopic colitis. If workup negative it is possible she has residual post infectious IBS symptoms.  - Check CRP, TTG IgA, IgA - Recommend Citrucel once daily  -We discussed certain food triggers to reduce such as caffeine, fructose, dairy, and artificial sweeteners. -Colonoscopy in [facility]  Avalina Benko, FNP-C [facility] 03/11/2023, 2:53 PM  Cc: Jarold Motto, [place]   I have seen the patient with Ms. Karma Ansley and she has served as a Neurosurgeon.  Eulah Pont, MD  I spent 45 minutes of time, including in depth chart review, independent  review of results as outlined above, communicating results with the patient directly, face-to-face time with the patient, coordinating care, ordering studies and medications as appropriate, and documentation.

## 2023-03-12 LAB — TISSUE TRANSGLUTAMINASE, IGA: (tTG) Ab, IgA: <1 U/mL

## 2023-03-12 LAB — IGA: Immunoglobulin A: 131 mg/dL (ref 70–320)

## 2023-03-16 ENCOUNTER — Encounter: Payer: Self-pay | Admitting: Internal Medicine

## 2023-03-18 DIAGNOSIS — M9905 Segmental and somatic dysfunction of pelvic region: Secondary | ICD-10-CM | POA: Diagnosis not present

## 2023-03-18 DIAGNOSIS — M9903 Segmental and somatic dysfunction of lumbar region: Secondary | ICD-10-CM | POA: Diagnosis not present

## 2023-03-18 DIAGNOSIS — M9901 Segmental and somatic dysfunction of cervical region: Secondary | ICD-10-CM | POA: Diagnosis not present

## 2023-03-18 DIAGNOSIS — M9904 Segmental and somatic dysfunction of sacral region: Secondary | ICD-10-CM | POA: Diagnosis not present

## 2023-03-30 ENCOUNTER — Encounter: Payer: Self-pay | Admitting: Internal Medicine

## 2023-03-30 ENCOUNTER — Ambulatory Visit: Payer: Medicare HMO | Admitting: Internal Medicine

## 2023-03-30 VITALS — BP 105/54 | HR 61 | Temp 97.3°F | Resp 12 | Ht 64.0 in | Wt 224.0 lb

## 2023-03-30 DIAGNOSIS — K5730 Diverticulosis of large intestine without perforation or abscess without bleeding: Secondary | ICD-10-CM

## 2023-03-30 DIAGNOSIS — D123 Benign neoplasm of transverse colon: Secondary | ICD-10-CM

## 2023-03-30 DIAGNOSIS — R152 Fecal urgency: Secondary | ICD-10-CM

## 2023-03-30 DIAGNOSIS — K635 Polyp of colon: Secondary | ICD-10-CM | POA: Diagnosis not present

## 2023-03-30 DIAGNOSIS — Z1211 Encounter for screening for malignant neoplasm of colon: Secondary | ICD-10-CM

## 2023-03-30 DIAGNOSIS — K5140 Inflammatory polyps of colon without complications: Secondary | ICD-10-CM | POA: Diagnosis not present

## 2023-03-30 DIAGNOSIS — K648 Other hemorrhoids: Secondary | ICD-10-CM

## 2023-03-30 DIAGNOSIS — R194 Change in bowel habit: Secondary | ICD-10-CM | POA: Diagnosis not present

## 2023-03-30 DIAGNOSIS — D122 Benign neoplasm of ascending colon: Secondary | ICD-10-CM

## 2023-03-30 DIAGNOSIS — E785 Hyperlipidemia, unspecified: Secondary | ICD-10-CM | POA: Diagnosis not present

## 2023-03-30 MED ORDER — SODIUM CHLORIDE 0.9 % IV SOLN: 500.0000 mL | Freq: Once | INTRAVENOUS | Status: AC

## 2023-03-30 NOTE — Progress Notes (Signed)
 GASTROENTEROLOGY PROCEDURE H&P NOTE   Primary Care Physician: Job Lukes, PA    Reason for Procedure:  Colon cancer screening, fecal urgency  Plan:    Colonoscopy  Patient is appropriate for endoscopic procedure(s) in the ambulatory ([facility]) setting.  The nature of the procedure, as well as the risks, benefits, and alternatives were carefully and thoroughly reviewed with the patient. Ample time for discussion and questions allowed. The patient understood, was satisfied, and agreed to proceed.     HPI: Sherri Smith is a 77 y.o. female who presents for colonoscopy for evaluation of colon cancer screening, fecal urgency.  Patient was most recently seen in the Gastroenterology Clinic on 03/11/23.  No interval change in medical history since that appointment. Please refer to that note for full details regarding GI history and clinical presentation.   Past Medical History:  Diagnosis Date   BMI 40.0-44.9, adult (HCC)    Elevated coronary artery calcium  score    Family history of cardiac disorder    Hyperlipidemia    Insomnia    Leg swelling     Past Surgical History:  Procedure Laterality Date   APPENDECTOMY     OVARIAN CYST REMOVAL     1972   TONSILLECTOMY      Prior to Admission medications   Medication Sig Start Date End Date Taking? Authorizing Provider  atorvastatin  (LIPITOR) 20 MG tablet TAKE 1 TABLET(20 MG) BY MOUTH DAILY 08/18/22  Yes Job Lukes, PA  B Complex Vitamins (B COMPLEX-B12) TABS Take 2 tablets by mouth daily in the afternoon. 03/30/22  Yes [provider]  Omega-3 Fatty Acids (FISH OIL) 1000 MG CAPS Take 1 capsule by mouth daily in the afternoon. 07/29/22  Yes [provider]  Probiotic Product (PROBIOTIC DAILY PO) Take 1 capsule by mouth daily in the afternoon.   Yes [provider]  Turmeric (QC TUMERIC COMPLEX) 500 MG CAPS Take 1 capsule by mouth daily in the afternoon. 03/30/22  Yes [provider]  VITAMIN D   PO Take 125 mcg by mouth. K 2 included   Yes [provider]  Zinc 100 MG TABS Take 1 tablet by mouth daily in the afternoon. 07/29/22  Yes [provider]  furosemide  (LASIX ) 20 MG tablet Take 1 tablet (20 mg total) by mouth daily as needed for edema. Patient not taking: Reported on 01/22/2023 09/30/22   Job Lukes, PA  gabapentin  (NEURONTIN ) 100 MG capsule Take 1-3 capsules (100-300 mg total) by mouth 3 (three) times daily as needed. Patient not taking: Reported on 01/22/2023 10/29/22   Joane Artist RAMAN, MD    Current Outpatient Medications  Medication Sig Dispense Refill   atorvastatin  (LIPITOR) 20 MG tablet TAKE 1 TABLET(20 MG) BY MOUTH DAILY 90 tablet 2   B Complex Vitamins (B COMPLEX-B12) TABS Take 2 tablets by mouth daily in the afternoon.     Omega-3 Fatty Acids (FISH OIL) 1000 MG CAPS Take 1 capsule by mouth daily in the afternoon.     Probiotic Product (PROBIOTIC DAILY PO) Take 1 capsule by mouth daily in the afternoon.     Turmeric (QC TUMERIC COMPLEX) 500 MG CAPS Take 1 capsule by mouth daily in the afternoon.     VITAMIN D  PO Take 125 mcg by mouth. K 2 included     Zinc 100 MG TABS Take 1 tablet by mouth daily in the afternoon.     furosemide  (LASIX ) 20 MG tablet Take 1 tablet (20 mg total) by mouth daily as  needed for edema. (Patient not taking: Reported on 01/22/2023) 30 tablet 1   gabapentin  (NEURONTIN ) 100 MG capsule Take 1-3 capsules (100-300 mg total) by mouth 3 (three) times daily as needed. (Patient not taking: Reported on 01/22/2023) 90 capsule 3   Current Facility-Administered Medications  Medication Dose Route Frequency Provider Last Rate Last Admin   0.9 %  sodium chloride  infusion  500 mL Intravenous Once Federico Rosario BROCKS, MD        Allergies as of 03/30/2023   (No Known Allergies)    Family History  Problem Relation Age of Onset   Breast cancer Mother    Osteoarthritis Mother    Depression Mother    Heart disease Mother    Prostate cancer  Father    Osteoarthritis Father    Hyperlipidemia Father    Hypertension Father    Heart disease Maternal Grandmother    Heart disease Maternal Grandfather    Colon cancer Neg Hx    Esophageal cancer Neg Hx    Stomach cancer Neg Hx     Social History   Socioeconomic History   Marital status: Married    Spouse name: Not on file   Number of children: 2   Years of education: Not on file   Highest education level: Some college, no degree  Occupational History   Occupation: Retired  Tobacco Use   Smoking status: Never   Smokeless tobacco: Never  Vaping Use   Vaping status: Never Used  Substance and Sexual Activity   Alcohol use: Yes    Alcohol/week: 4.0 standard drinks of alcohol    Types: 4 Glasses of wine per week    Comment: one glass 5-6x/week   Drug use: Never   Sexual activity: Not on file  Other Topics Concern   Not on file  Social History Narrative   From the [place]   Retired    Moved to [place] to be closer to her daughter   Social Drivers of Corporate Investment Banker Strain: Low Risk  (10/13/2022)   Overall Financial Resource Strain (CARDIA)    Difficulty of Paying Living Expenses: Not hard at all  Food Insecurity: No Food Insecurity (10/13/2022)   Hunger Vital Sign    Worried About Running Out of Food in the Last Year: Never true    Ran Out of Food in the Last Year: Never true  Transportation Needs: No Transportation Needs (10/13/2022)   PRAPARE - Administrator, Civil Service (Medical): No    Lack of Transportation (Non-Medical): No  Physical Activity: Insufficiently Active (10/13/2022)   Exercise Vital Sign    Days of Exercise per Week: 3 days    Minutes of Exercise per Session: 30 min  Stress: No Stress Concern Present (10/13/2022)   [employer] of Occupational Health - Occupational Stress Questionnaire    Feeling of Stress : Not at all  Social Connections: Moderately Integrated (10/13/2022)   Social Connection and Isolation Panel  [NHANES]    Frequency of Communication with Friends and Family: Once a week    Frequency of Social Gatherings with Friends and Family: Once a week    Attends Religious Services: More than 4 times per year    Active Member of [employer] or Organizations: Yes    Attends Banker Meetings: More than 4 times per year    Marital Status: Married  Catering Manager Violence: Not At Risk (10/13/2022)   Humiliation, Afraid, Rape, and Kick questionnaire  Fear of Current or Ex-Partner: No    Emotionally Abused: No    Physically Abused: No    Sexually Abused: No    Physical Exam: Vital signs in last 24 hours: BP (!) 157/90   Pulse 61   Temp (!) 97.3 F (36.3 C)   Ht 5' 4 (1.626 m)   Wt 224 lb (101.6 kg)   SpO2 96%   BMI 38.45 kg/m  GEN: NAD EYE: Sclerae anicteric ENT: MMM CV: Non-tachycardic Pulm: No increased WOB GI: Soft NEURO:  Alert & Oriented   Estefana Kidney, MD [facility]   03/30/2023 12:23 PM

## 2023-03-30 NOTE — Patient Instructions (Addendum)
Resume previous diet Continue present medications Await pathology results  Handouts/information given for polyps, diverticulosis   YOU HAD AN ENDOSCOPIC PROCEDURE TODAY AT THE [facility]:   Refer to the procedure report that was given to you for any specific questions about what was found during the examination.  If the procedure report does not answer your questions, please call your gastroenterologist to clarify.  If you requested that your care partner not be given the details of your procedure findings, then the procedure report has been included in a sealed envelope for you to review at your convenience later.  YOU SHOULD EXPECT: Some feelings of bloating in the abdomen. Passage of more gas than usual.  Walking can help get rid of the air that was put into your GI tract during the procedure and reduce the bloating. If you had a lower endoscopy (such as a colonoscopy or flexible sigmoidoscopy) you may notice spotting of blood in your stool or on the toilet paper. If you underwent a bowel prep for your procedure, you may not have a normal bowel movement for a few days.  Please Note:  You might notice some irritation and congestion in your nose or some drainage.  This is from the oxygen used during your procedure.  There is no need for concern and it should clear up in a day or so.  SYMPTOMS TO REPORT IMMEDIATELY:  Following lower endoscopy (colonoscopy or flexible sigmoidoscopy):  Excessive amounts of blood in the stool  Significant tenderness or worsening of abdominal pains  Swelling of the abdomen that is new, acute  Fever of 100F or higher  For urgent or emergent issues, a gastroenterologist can be reached at any hour by calling [phone]. Do not use MyChart messaging for urgent concerns.    DIET:  We do recommend a small meal at first, but then you may proceed to your regular diet.  Drink plenty of fluids but you should avoid alcoholic beverages for 24  hours.  ACTIVITY:  You should plan to take it easy for the rest of today and you should NOT DRIVE or use heavy machinery until tomorrow (because of the sedation medicines used during the test).    FOLLOW UP: Our staff will call the number listed on your records the next business day following your procedure.  We will call around 7:15- 8:00 am to check on you and address any questions or concerns that you may have regarding the information given to you following your procedure. If we do not reach you, we will leave a message.     If any biopsies were taken you will be contacted by phone or by letter within the next 1-3 weeks.  Please call us at [phone] if you have not heard about the biopsies in 3 weeks.    SIGNATURES/CONFIDENTIALITY: You and/or your care partner have signed paperwork which will be entered into your electronic medical record.  These signatures attest to the fact that that the information above on your After Visit Summary has been reviewed and is understood.  Full responsibility of the confidentiality of this discharge information lies with you and/or your care-partner. 

## 2023-03-30 NOTE — Progress Notes (Signed)
 Called to room to assist during endoscopic procedure.  Patient ID and intended procedure confirmed with present staff. Received instructions for my participation in the procedure from the performing physician.

## 2023-03-30 NOTE — Op Note (Signed)
 [facility] Patient Name: Sherri Smith Procedure Date: 03/30/2023 12:57 PM MRN: [id number] Endoscopist: Rosario Estefana Kidney , , [id number] Age: 77 Referring MD:  Date of Birth: May 10, 1945 Gender: Female Account #: [id number] Procedure:                Colonoscopy Indications:              Screening for colorectal malignant neoplasm,                            Incidental - Change in bowel habits Medicines:                Monitored Anesthesia Care Procedure:                Pre-Anesthesia Assessment:                           - Prior to the procedure, a History and Physical                            was performed, and patient medications and                            allergies were reviewed. The patient's tolerance of                            previous anesthesia was also reviewed. The risks                            and benefits of the procedure and the sedation                            options and risks were discussed with the patient.                            All questions were answered, and informed consent                            was obtained. Prior Anticoagulants: The patient has                            taken no anticoagulant or antiplatelet agents. ASA                            Grade Assessment: III - A patient with severe                            systemic disease. After reviewing the risks and                            benefits, the patient was deemed in satisfactory                            condition to undergo the procedure.  After obtaining informed consent, the colonoscope                            was passed under direct vision. Throughout the                            procedure, the patient's blood pressure, pulse, and                            oxygen saturations were monitored continuously. The                            Olympus CF-HQ190L ([id number]) Colonoscope was                            introduced through  the anus and advanced to the the                            terminal ileum. The colonoscopy was performed                            without difficulty. The patient tolerated the                            procedure well. The quality of the bowel                            preparation was good. The terminal ileum, ileocecal                            valve, appendiceal orifice, and rectum were                            photographed. Scope In: 1:05:09 PM Scope Out: 1:20:56 PM Scope Withdrawal Time: 0 hours 11 minutes 46 seconds  Total Procedure Duration: 0 hours 15 minutes 47 seconds  Findings:                 The terminal ileum appeared normal.                           Three sessile polyps were found in the transverse                            colon and ascending colon. The polyps were 2 to 3                            mm in size. These polyps were removed with a cold                            biopsy forceps. Resection and retrieval were                            complete.  Multiple diverticula were found in the sigmoid                            colon, descending colon and transverse colon.                           Non-bleeding internal hemorrhoids were found during                            retroflexion.                           Biopsies for histology were taken with a cold                            forceps from the entire colon for evaluation of                            microscopic colitis. Complications:            No immediate complications. Estimated Blood Loss:     Estimated blood loss was minimal. Impression:               - The examined portion of the ileum was normal.                           - Three 2 to 3 mm polyps in the transverse colon                            and in the ascending colon, removed with a cold                            biopsy forceps. Resected and retrieved.                           - Diverticulosis in the sigmoid  colon, in the                            descending colon and in the transverse colon.                           - Non-bleeding internal hemorrhoids.                           - Biopsies were taken with a cold forceps from the                            entire colon for evaluation of microscopic colitis. Recommendation:           - Discharge patient to home (with escort).                           - Await pathology results.                           - The findings  and recommendations were discussed                            with the patient. Dr Estefana Federico Rosario Estefana Federico,  03/30/2023 1:27:45 PM

## 2023-03-30 NOTE — Progress Notes (Signed)
 Sedate, gd SR, tolerated procedure well, VSS, report to RN

## 2023-03-30 NOTE — Progress Notes (Signed)
Pt's states no medical or surgical changes since previsit or office visit. 

## 2023-04-02 ENCOUNTER — Telehealth: Payer: Self-pay

## 2023-04-02 ENCOUNTER — Encounter: Payer: Self-pay | Admitting: Internal Medicine

## 2023-04-02 NOTE — Telephone Encounter (Signed)
 Follow up call to pt, lm for pt to call if having any difficulty with normal activities or eating and drinking.  Also to call if any other questions or concerns.

## 2023-04-06 LAB — SURGICAL PATHOLOGY

## 2023-04-07 ENCOUNTER — Encounter: Payer: Self-pay | Admitting: Internal Medicine

## 2023-05-13 DIAGNOSIS — M9903 Segmental and somatic dysfunction of lumbar region: Secondary | ICD-10-CM | POA: Diagnosis not present

## 2023-05-13 DIAGNOSIS — M9905 Segmental and somatic dysfunction of pelvic region: Secondary | ICD-10-CM | POA: Diagnosis not present

## 2023-05-13 DIAGNOSIS — M9904 Segmental and somatic dysfunction of sacral region: Secondary | ICD-10-CM | POA: Diagnosis not present

## 2023-05-13 DIAGNOSIS — M9901 Segmental and somatic dysfunction of cervical region: Secondary | ICD-10-CM | POA: Diagnosis not present

## 2023-05-27 DIAGNOSIS — H25013 Cortical age-related cataract, bilateral: Secondary | ICD-10-CM | POA: Diagnosis not present

## 2023-05-27 DIAGNOSIS — H2511 Age-related nuclear cataract, right eye: Secondary | ICD-10-CM | POA: Diagnosis not present

## 2023-05-27 DIAGNOSIS — M9905 Segmental and somatic dysfunction of pelvic region: Secondary | ICD-10-CM | POA: Diagnosis not present

## 2023-05-27 DIAGNOSIS — M9901 Segmental and somatic dysfunction of cervical region: Secondary | ICD-10-CM | POA: Diagnosis not present

## 2023-05-27 DIAGNOSIS — M9903 Segmental and somatic dysfunction of lumbar region: Secondary | ICD-10-CM | POA: Diagnosis not present

## 2023-05-27 DIAGNOSIS — H2513 Age-related nuclear cataract, bilateral: Secondary | ICD-10-CM | POA: Diagnosis not present

## 2023-05-27 DIAGNOSIS — H25043 Posterior subcapsular polar age-related cataract, bilateral: Secondary | ICD-10-CM | POA: Diagnosis not present

## 2023-05-27 DIAGNOSIS — H18413 Arcus senilis, bilateral: Secondary | ICD-10-CM | POA: Diagnosis not present

## 2023-05-27 DIAGNOSIS — M9904 Segmental and somatic dysfunction of sacral region: Secondary | ICD-10-CM | POA: Diagnosis not present

## 2023-06-10 DIAGNOSIS — M9901 Segmental and somatic dysfunction of cervical region: Secondary | ICD-10-CM | POA: Diagnosis not present

## 2023-06-10 DIAGNOSIS — M9903 Segmental and somatic dysfunction of lumbar region: Secondary | ICD-10-CM | POA: Diagnosis not present

## 2023-06-10 DIAGNOSIS — M9905 Segmental and somatic dysfunction of pelvic region: Secondary | ICD-10-CM | POA: Diagnosis not present

## 2023-06-10 DIAGNOSIS — M9904 Segmental and somatic dysfunction of sacral region: Secondary | ICD-10-CM | POA: Diagnosis not present

## 2023-06-16 DIAGNOSIS — E669 Obesity, unspecified: Secondary | ICD-10-CM | POA: Diagnosis not present

## 2023-06-16 DIAGNOSIS — G4734 Idiopathic sleep related nonobstructive alveolar hypoventilation: Secondary | ICD-10-CM | POA: Diagnosis not present

## 2023-06-16 DIAGNOSIS — G4733 Obstructive sleep apnea (adult) (pediatric): Secondary | ICD-10-CM | POA: Diagnosis not present

## 2023-06-17 DIAGNOSIS — M9905 Segmental and somatic dysfunction of pelvic region: Secondary | ICD-10-CM | POA: Diagnosis not present

## 2023-06-17 DIAGNOSIS — M9901 Segmental and somatic dysfunction of cervical region: Secondary | ICD-10-CM | POA: Diagnosis not present

## 2023-06-17 DIAGNOSIS — M9904 Segmental and somatic dysfunction of sacral region: Secondary | ICD-10-CM | POA: Diagnosis not present

## 2023-06-17 DIAGNOSIS — M9903 Segmental and somatic dysfunction of lumbar region: Secondary | ICD-10-CM | POA: Diagnosis not present

## 2023-06-24 DIAGNOSIS — M9903 Segmental and somatic dysfunction of lumbar region: Secondary | ICD-10-CM | POA: Diagnosis not present

## 2023-06-24 DIAGNOSIS — M9901 Segmental and somatic dysfunction of cervical region: Secondary | ICD-10-CM | POA: Diagnosis not present

## 2023-06-24 DIAGNOSIS — M9904 Segmental and somatic dysfunction of sacral region: Secondary | ICD-10-CM | POA: Diagnosis not present

## 2023-06-24 DIAGNOSIS — M9905 Segmental and somatic dysfunction of pelvic region: Secondary | ICD-10-CM | POA: Diagnosis not present

## 2023-07-01 DIAGNOSIS — M9903 Segmental and somatic dysfunction of lumbar region: Secondary | ICD-10-CM | POA: Diagnosis not present

## 2023-07-01 DIAGNOSIS — M9901 Segmental and somatic dysfunction of cervical region: Secondary | ICD-10-CM | POA: Diagnosis not present

## 2023-07-01 DIAGNOSIS — M9902 Segmental and somatic dysfunction of thoracic region: Secondary | ICD-10-CM | POA: Diagnosis not present

## 2023-07-01 DIAGNOSIS — M62830 Muscle spasm of back: Secondary | ICD-10-CM | POA: Diagnosis not present

## 2023-07-15 DIAGNOSIS — M9902 Segmental and somatic dysfunction of thoracic region: Secondary | ICD-10-CM | POA: Diagnosis not present

## 2023-07-15 DIAGNOSIS — M9901 Segmental and somatic dysfunction of cervical region: Secondary | ICD-10-CM | POA: Diagnosis not present

## 2023-07-15 DIAGNOSIS — M9903 Segmental and somatic dysfunction of lumbar region: Secondary | ICD-10-CM | POA: Diagnosis not present

## 2023-07-15 DIAGNOSIS — M62830 Muscle spasm of back: Secondary | ICD-10-CM | POA: Diagnosis not present

## 2023-07-22 DIAGNOSIS — M9901 Segmental and somatic dysfunction of cervical region: Secondary | ICD-10-CM | POA: Diagnosis not present

## 2023-07-22 DIAGNOSIS — M62830 Muscle spasm of back: Secondary | ICD-10-CM | POA: Diagnosis not present

## 2023-07-22 DIAGNOSIS — M9903 Segmental and somatic dysfunction of lumbar region: Secondary | ICD-10-CM | POA: Diagnosis not present

## 2023-07-22 DIAGNOSIS — M9902 Segmental and somatic dysfunction of thoracic region: Secondary | ICD-10-CM | POA: Diagnosis not present

## 2023-07-26 DIAGNOSIS — H2511 Age-related nuclear cataract, right eye: Secondary | ICD-10-CM | POA: Diagnosis not present

## 2023-07-27 DIAGNOSIS — H25042 Posterior subcapsular polar age-related cataract, left eye: Secondary | ICD-10-CM | POA: Diagnosis not present

## 2023-07-27 DIAGNOSIS — H25012 Cortical age-related cataract, left eye: Secondary | ICD-10-CM | POA: Diagnosis not present

## 2023-07-27 DIAGNOSIS — H2512 Age-related nuclear cataract, left eye: Secondary | ICD-10-CM | POA: Diagnosis not present

## 2023-08-02 DIAGNOSIS — H04123 Dry eye syndrome of bilateral lacrimal glands: Secondary | ICD-10-CM | POA: Diagnosis not present

## 2023-08-02 DIAGNOSIS — H5201 Hypermetropia, right eye: Secondary | ICD-10-CM | POA: Diagnosis not present

## 2023-08-02 DIAGNOSIS — H2511 Age-related nuclear cataract, right eye: Secondary | ICD-10-CM | POA: Diagnosis not present

## 2023-08-02 DIAGNOSIS — H52221 Regular astigmatism, right eye: Secondary | ICD-10-CM | POA: Diagnosis not present

## 2023-08-02 DIAGNOSIS — H25013 Cortical age-related cataract, bilateral: Secondary | ICD-10-CM | POA: Diagnosis not present

## 2023-08-05 DIAGNOSIS — M9902 Segmental and somatic dysfunction of thoracic region: Secondary | ICD-10-CM | POA: Diagnosis not present

## 2023-08-05 DIAGNOSIS — M9903 Segmental and somatic dysfunction of lumbar region: Secondary | ICD-10-CM | POA: Diagnosis not present

## 2023-08-05 DIAGNOSIS — M62830 Muscle spasm of back: Secondary | ICD-10-CM | POA: Diagnosis not present

## 2023-08-05 DIAGNOSIS — M9901 Segmental and somatic dysfunction of cervical region: Secondary | ICD-10-CM | POA: Diagnosis not present

## 2023-08-09 DIAGNOSIS — H2512 Age-related nuclear cataract, left eye: Secondary | ICD-10-CM | POA: Diagnosis not present

## 2023-08-16 DIAGNOSIS — H25012 Cortical age-related cataract, left eye: Secondary | ICD-10-CM | POA: Diagnosis not present

## 2023-08-16 DIAGNOSIS — H52223 Regular astigmatism, bilateral: Secondary | ICD-10-CM | POA: Diagnosis not present

## 2023-08-16 DIAGNOSIS — H2512 Age-related nuclear cataract, left eye: Secondary | ICD-10-CM | POA: Diagnosis not present

## 2023-08-16 DIAGNOSIS — H04123 Dry eye syndrome of bilateral lacrimal glands: Secondary | ICD-10-CM | POA: Diagnosis not present

## 2023-08-16 DIAGNOSIS — H5202 Hypermetropia, left eye: Secondary | ICD-10-CM | POA: Diagnosis not present

## 2023-08-19 DIAGNOSIS — M9903 Segmental and somatic dysfunction of lumbar region: Secondary | ICD-10-CM | POA: Diagnosis not present

## 2023-08-19 DIAGNOSIS — M62830 Muscle spasm of back: Secondary | ICD-10-CM | POA: Diagnosis not present

## 2023-08-19 DIAGNOSIS — M9901 Segmental and somatic dysfunction of cervical region: Secondary | ICD-10-CM | POA: Diagnosis not present

## 2023-08-19 DIAGNOSIS — M9902 Segmental and somatic dysfunction of thoracic region: Secondary | ICD-10-CM | POA: Diagnosis not present

## 2023-08-26 DIAGNOSIS — M9902 Segmental and somatic dysfunction of thoracic region: Secondary | ICD-10-CM | POA: Diagnosis not present

## 2023-08-26 DIAGNOSIS — M9901 Segmental and somatic dysfunction of cervical region: Secondary | ICD-10-CM | POA: Diagnosis not present

## 2023-08-26 DIAGNOSIS — M62830 Muscle spasm of back: Secondary | ICD-10-CM | POA: Diagnosis not present

## 2023-08-26 DIAGNOSIS — M9903 Segmental and somatic dysfunction of lumbar region: Secondary | ICD-10-CM | POA: Diagnosis not present

## 2023-09-09 DIAGNOSIS — M62830 Muscle spasm of back: Secondary | ICD-10-CM | POA: Diagnosis not present

## 2023-09-09 DIAGNOSIS — Z0100 Encounter for examination of eyes and vision without abnormal findings: Secondary | ICD-10-CM | POA: Diagnosis not present

## 2023-09-09 DIAGNOSIS — M9902 Segmental and somatic dysfunction of thoracic region: Secondary | ICD-10-CM | POA: Diagnosis not present

## 2023-09-09 DIAGNOSIS — M9901 Segmental and somatic dysfunction of cervical region: Secondary | ICD-10-CM | POA: Diagnosis not present

## 2023-09-09 DIAGNOSIS — M9903 Segmental and somatic dysfunction of lumbar region: Secondary | ICD-10-CM | POA: Diagnosis not present

## 2023-09-16 DIAGNOSIS — M9901 Segmental and somatic dysfunction of cervical region: Secondary | ICD-10-CM | POA: Diagnosis not present

## 2023-09-16 DIAGNOSIS — M9902 Segmental and somatic dysfunction of thoracic region: Secondary | ICD-10-CM | POA: Diagnosis not present

## 2023-09-16 DIAGNOSIS — M9903 Segmental and somatic dysfunction of lumbar region: Secondary | ICD-10-CM | POA: Diagnosis not present

## 2023-09-16 DIAGNOSIS — M62830 Muscle spasm of back: Secondary | ICD-10-CM | POA: Diagnosis not present

## 2023-09-17 ENCOUNTER — Encounter: Payer: Self-pay | Admitting: Physician Assistant

## 2023-09-17 MED ORDER — ATORVASTATIN CALCIUM 20 MG PO TABS: ORAL_TABLET | ORAL | 1 refills | Status: AC

## 2023-09-17 NOTE — Telephone Encounter (Signed)
 Okay to send in Rx Atorvastatin  20 mg?

## 2023-09-23 DIAGNOSIS — M9902 Segmental and somatic dysfunction of thoracic region: Secondary | ICD-10-CM | POA: Diagnosis not present

## 2023-09-23 DIAGNOSIS — M9903 Segmental and somatic dysfunction of lumbar region: Secondary | ICD-10-CM | POA: Diagnosis not present

## 2023-09-23 DIAGNOSIS — M9901 Segmental and somatic dysfunction of cervical region: Secondary | ICD-10-CM | POA: Diagnosis not present

## 2023-09-23 DIAGNOSIS — M62830 Muscle spasm of back: Secondary | ICD-10-CM | POA: Diagnosis not present

## 2023-10-07 DIAGNOSIS — M9902 Segmental and somatic dysfunction of thoracic region: Secondary | ICD-10-CM | POA: Diagnosis not present

## 2023-10-07 DIAGNOSIS — M9903 Segmental and somatic dysfunction of lumbar region: Secondary | ICD-10-CM | POA: Diagnosis not present

## 2023-10-07 DIAGNOSIS — M9901 Segmental and somatic dysfunction of cervical region: Secondary | ICD-10-CM | POA: Diagnosis not present

## 2023-10-07 DIAGNOSIS — M62830 Muscle spasm of back: Secondary | ICD-10-CM | POA: Diagnosis not present

## 2023-10-19 ENCOUNTER — Ambulatory Visit (INDEPENDENT_AMBULATORY_CARE_PROVIDER_SITE_OTHER): Payer: Medicare HMO

## 2023-10-19 VITALS — Ht 65.0 in | Wt 224.0 lb

## 2023-10-19 DIAGNOSIS — Z0000 Encounter for general adult medical examination without abnormal findings: Secondary | ICD-10-CM | POA: Diagnosis not present

## 2023-10-19 NOTE — Progress Notes (Signed)
 5 5  Subjective:   Sherri Smith is a 78 y.o. who presents for a Medicare Wellness preventive visit.  As a reminder, Annual Wellness Visits don't include a physical exam, and some assessments may be limited, especially if this visit is performed virtually. We may recommend an in-person follow-up visit with your provider if needed.  Visit Complete: Virtual I connected with  Sherri Smith on 10/19/23 by a audio enabled telemedicine application and verified that I am speaking with the correct person using two identifiers.  Patient Location: Home  Provider Location: Office/Clinic  I discussed the limitations of evaluation and management by telemedicine. The patient expressed understanding and agreed to proceed.  Vital Signs: Because this visit was a virtual/telehealth visit, some criteria may be missing or patient reported. Any vitals not documented were not able to be obtained and vitals that have been documented are patient reported.  VideoDeclined- This patient declined Librarian, academic. Therefore the visit was completed with audio only.  Persons Participating in Visit: Patient.  AWV Questionnaire: Yes: Patient Medicare AWV questionnaire was completed by the patient on 10/18/23; I have confirmed that all information answered by patient is correct and no changes since this date.  Cardiac Risk Factors include: advanced age (>44men, >76 women)     Objective:    Today's Vitals   10/19/23 1407  Weight: 224 lb (101.6 kg)  Height: 5' 5 (1.651 m)   Body mass index is 37.28 kg/m.     10/19/2023    2:13 PM 10/13/2022    3:08 PM 01/13/2022    1:46 PM 10/24/2021   10:36 AM 10/18/2020   10:57 AM 11/13/2016   12:40 PM 02/05/2014    3:04 PM  Advanced Directives  Does Patient Have a Medical Advance Directive? Yes Yes No Yes Yes No  No   Type of Estate agent of [place];Living will Healthcare Power of [place];Living will   Living will     Does patient want to make changes to medical advance directive?    Yes (MAU/Ambulatory/Procedural Areas - Information given)     Copy of Healthcare Power of Attorney in Chart? No - copy requested No - copy requested       Would patient like information on creating a medical advance directive?   No - Patient declined    No - patient declined information      Data saved with a previous flowsheet row definition    Current Medications (verified) Outpatient Encounter Medications as of 10/19/2023  Medication Sig   atorvastatin  (LIPITOR) 20 MG tablet TAKE 1 TABLET(20 MG) BY MOUTH DAILY   B Complex Vitamins (B COMPLEX-B12) TABS Take 2 tablets by mouth daily in the afternoon.   Omega-3 Fatty Acids (FISH OIL) 1000 MG CAPS Take 1 capsule by mouth daily in the afternoon.   Probiotic Product (PROBIOTIC DAILY PO) Take 1 capsule by mouth daily in the afternoon.   Turmeric (QC TUMERIC COMPLEX) 500 MG CAPS Take 1 capsule by mouth daily in the afternoon.   VITAMIN D  PO Take 125 mcg by mouth. K 2 included   Zinc 100 MG TABS Take 1 tablet by mouth daily in the afternoon.   [DISCONTINUED] furosemide  (LASIX ) 20 MG tablet Take 1 tablet (20 mg total) by mouth daily as needed for edema. (Patient not taking: Reported on 01/22/2023)   [DISCONTINUED] gabapentin  (NEURONTIN ) 100 MG capsule Take 1-3 capsules (100-300 mg total) by mouth 3 (three) times daily as needed. (Patient not taking: Reported  on 01/22/2023)   Facility-Administered Encounter Medications as of 10/19/2023  Medication   0.9 %  sodium chloride  infusion    Allergies (verified) Patient has no known allergies.   History: Past Medical History:  Diagnosis Date   BMI 40.0-44.9, adult (HCC)    Elevated coronary artery calcium  score    Family history of cardiac disorder    Hyperlipidemia    Insomnia    Leg swelling    Past Surgical History:  Procedure Laterality Date   APPENDECTOMY     OVARIAN CYST REMOVAL     1972   TONSILLECTOMY     Family  History  Problem Relation Age of Onset   Breast cancer Mother    Osteoarthritis Mother    Depression Mother    Heart disease Mother    Prostate cancer Father    Osteoarthritis Father    Hyperlipidemia Father    Hypertension Father    Heart disease Maternal Grandmother    Heart disease Maternal Grandfather    Colon cancer Neg Hx    Esophageal cancer Neg Hx    Stomach cancer Neg Hx    Social History   Socioeconomic History   Marital status: Married    Spouse name: Not on file   Number of children: 2   Years of education: Not on file   Highest education level: Some college, no degree  Occupational History   Occupation: Retired  Tobacco Use   Smoking status: Never   Smokeless tobacco: Never  Vaping Use   Vaping status: Never Used  Substance and Sexual Activity   Alcohol use: Yes    Alcohol/week: 4.0 standard drinks of alcohol    Types: 4 Glasses of wine per week    Comment: one glass 5-6x/week   Drug use: Never   Sexual activity: Not on file  Other Topics Concern   Not on file  Social History Narrative   From the [place]   Retired    Moved to [place] to be closer to her daughter   Social Drivers of Corporate investment banker Strain: Low Risk  (10/18/2023)   Overall Financial Resource Strain (CARDIA)    Difficulty of Paying Living Expenses: Not hard at all  Food Insecurity: No Food Insecurity (10/18/2023)   Hunger Vital Sign    Worried About Running Out of Food in the Last Year: Never true    Ran Out of Food in the Last Year: Never true  Transportation Needs: No Transportation Needs (10/18/2023)   PRAPARE - Administrator, Civil Service (Medical): No    Lack of Transportation (Non-Medical): No  Physical Activity: Insufficiently Active (10/18/2023)   Exercise Vital Sign    Days of Exercise per Week: 3 days    Minutes of Exercise per Session: 20 min  Stress: Stress Concern Present (10/18/2023)   [employer] of Occupational Health - Occupational  Stress Questionnaire    Feeling of Stress: To some extent  Social Connections: Socially Integrated (10/18/2023)   Social Connection and Isolation Panel    Frequency of Communication with Friends and Family: Twice a week    Frequency of Social Gatherings with Friends and Family: Once a week    Attends Religious Services: More than 4 times per year    Active Member of [employer] or Organizations: Yes    Attends Engineer, structural: More than 4 times per year    Marital Status: Married    Tobacco Counseling Counseling given: Not Answered  Clinical Intake:  Pre-visit preparation completed: Yes  Pain : No/denies pain     BMI - recorded: 37.28 Nutritional Status: BMI > 30  Obese Nutritional Risks: None Diabetes: No  No results found for: HGBA1C   How often do you need to have someone help you when you read instructions, pamphlets, or other written materials from your doctor or pharmacy?: 1 - Never  Interpreter Needed?: No  Information entered by :: Ellouise Haws, LPN   Activities of Daily Living     10/18/2023    3:25 PM  In your present state of health, do you have any difficulty performing the following activities:  Hearing? 0  Vision? 0  Difficulty concentrating or making decisions? 0  Walking or climbing stairs? 0  Dressing or bathing? 0  Doing errands, shopping? 0  Preparing Food and eating ? N  Using the Toilet? N  In the past six months, have you accidently leaked urine? Y  Comment wears a pad  Do you have problems with loss of bowel control? N  Managing your Medications? N  Managing your Finances? N  Housekeeping or managing your Housekeeping? N    Patient Care Team: Job Lukes, [place] as PCP - General (Physician Assistant) Lonni Slain, MD as PCP - Cardiology (Cardiology)  I have updated your Care Teams any recent Medical Services you may have received from other providers in the past year.     Assessment:   This is a routine  wellness examination for Sherri Smith.  Hearing/Vision screen Hearing Screening - Comments:: Pt denies any hearing issues  Vision Screening - Comments:: Wears rx glasses - up to date with routine eye exams with Dr roselie Ablott    Goals Addressed               This Visit's Progress     Patient Stated (pt-stated)        Weight loss        Depression Screen     10/19/2023    2:10 PM 11/19/2022   10:09 AM 10/13/2022    3:06 PM 09/30/2022    2:52 PM 05/27/2022    1:11 PM 01/06/2022   10:56 AM 10/24/2021   10:33 AM  PHQ 2/9 Scores  PHQ - 2 Score 0 0 0 0 0 0 0  PHQ- 9 Score  4 0 8 4 5      Fall Risk     10/18/2023    3:25 PM 10/13/2022   10:57 AM 05/27/2022    1:11 PM 10/24/2021   10:38 AM 10/18/2020   10:59 AM  Fall Risk   Falls in the past year? 0 1 0 0 0  Number falls in past yr: 0 0 0 0 0  Injury with Fall?  1 0 0 0  Comment  right hand bruises as well on back     Risk for fall due to : No Fall Risks Impaired vision;Impaired mobility No Fall Risks Impaired vision;Impaired balance/gait Impaired vision  Risk for fall due to: Comment  knees  with vertigo at times   Follow up Falls prevention discussed Falls prevention discussed Falls evaluation completed Falls prevention discussed  Falls prevention discussed      Data saved with a previous flowsheet row definition    MEDICARE RISK AT HOME:  Medicare Risk at Home Any stairs in or around the home?: (Patient-Rptd) No Home free of loose throw rugs in walkways, pet beds, electrical cords, etc?: (Patient-Rptd) Yes Adequate lighting in your home  to reduce risk of falls?: (Patient-Rptd) Yes Life alert?: (Patient-Rptd) No Use of a cane, walker or w/c?: (Patient-Rptd) No Grab bars in the bathroom?: (Patient-Rptd) Yes Shower chair or bench in shower?: (Patient-Rptd) No Elevated toilet seat or a handicapped toilet?: (Patient-Rptd) Yes  TIMED UP AND GO:  Was the test performed?  No  Cognitive Function: 6CIT completed         10/19/2023    2:14 PM 10/13/2022    3:10 PM 10/24/2021   10:43 AM 10/18/2020   11:04 AM  6CIT Screen  What Year? 0 points 0 points 0 points 0 points  What month? 0 points 0 points 0 points 0 points  What time? 0 points 0 points 0 points 0 points  Count back from 20 0 points 0 points 0 points 0 points  Months in reverse 0 points 0 points 0 points 2 points  Repeat phrase 0 points 0 points 0 points 0 points  Total Score 0 points 0 points 0 points 2 points    Immunizations Immunization History  Administered Date(s) Administered   PFIZER(Purple Top)SARS-COV-2 Vaccination 05/29/2019, 06/27/2019, 01/04/2020, 08/09/2020   PNEUMOCOCCAL CONJUGATE-20 11/14/2022   Pfizer Covid-19 Vaccine Bivalent Booster 54yrs & up 01/11/2021   Pneumococcal Polysaccharide-23 11/12/2011, 03/29/2020   Tdap 08/29/2014   Zoster Recombinant(Shingrix) 01/07/2021, 04/14/2021    Screening Tests Health Maintenance  Topic Date Due   Hepatitis C Screening  Never done   DEXA SCAN  Never done   COVID-19 Vaccine (6 - 2024-25 season) 11/29/2022   INFLUENZA VACCINE  10/29/2023   DTaP/Tdap/Td (2 - Td or Tdap) 08/28/2024   Medicare Annual Wellness (AWV)  10/18/2024   Pneumococcal Vaccine: 50+ Years  Completed   Zoster Vaccines- Shingrix  Completed   Hepatitis B Vaccines  Aged Out   HPV VACCINES  Aged Out   Meningococcal B Vaccine  Aged Out   Colonoscopy  Discontinued    Health Maintenance  Health Maintenance Due  Topic Date Due   Hepatitis C Screening  Never done   DEXA SCAN  Never done   COVID-19 Vaccine (6 - 2024-25 season) 11/29/2022   Health Maintenance Items Addressed: See Nurse Notes at the end of this note  Additional Screening:  Vision Screening: Recommended annual ophthalmology exams for early detection of glaucoma and other disorders of the eye. Would you like a referral to an eye doctor? No    Dental Screening: Recommended annual dental exams for proper oral hygiene  Community Resource Referral  / Chronic Care Management: CRR required this visit?  No   CCM required this visit?  No   Plan:    I have personally reviewed and noted the following in the patient's chart:   Medical and social history Use of alcohol, tobacco or illicit drugs  Current medications and supplements including opioid prescriptions. Patient is not currently taking opioid prescriptions. Functional ability and status Nutritional status Physical activity Advanced directives List of other physicians Hospitalizations, surgeries, and ER visits in previous 12 months Vitals Screenings to include cognitive, depression, and falls Referrals and appointments  In addition, I have reviewed and discussed with patient certain preventive protocols, quality metrics, and best practice recommendations. A written personalized care plan for preventive services as well as general preventive health recommendations were provided to patient.   Ellouise VEAR Haws, LPN   [id number]   After Visit Summary: (MyChart) Due to this being a telephonic visit, the after visit summary with patients personalized plan was offered to patient via MyChart  Notes: Nothing significant to report at this time. Pt declined dexa scan at this time.

## 2023-11-02 DIAGNOSIS — G4730 Sleep apnea, unspecified: Secondary | ICD-10-CM | POA: Diagnosis not present

## 2023-11-02 DIAGNOSIS — R0902 Hypoxemia: Secondary | ICD-10-CM | POA: Diagnosis not present

## 2023-11-24 ENCOUNTER — Ambulatory Visit: Admitting: Sports Medicine

## 2023-11-30 NOTE — Progress Notes (Unsigned)
   LILLETTE Ileana Collet, PhD, LAT, ATC acting as a scribe for Artist Lloyd, MD.  Sherri Smith is a 78 y.o. female who presents to [facility] today for exacerbation of her R wrist pain. Pt was last seen by Dr. Lloyd on 10/29/22 and was given a R carpal tunnel steroid injection and advised to cont wrist brace.  Today, pt reports R wrist pain returned a couple months ago intermittently. Paresthesia has been more persistent recently. She's been doing a lot of art projects, needle point etc.   Dx imaging: 01/19/22 R & L hand XR   Pertinent review of systems: No fevers or chills  Relevant historical information: Carpal tunnel syndrome.   Exam:  BP (!) 152/88   Pulse 63   Ht 5' 5 (1.651 m)   Wt 234 lb (106.1 kg)   SpO2 98%   BMI 38.94 kg/m  General: Well Developed, well nourished, and in no acute distress.   MSK: Right hand and wrist are normal-appearing Normal motion and strength.  Positive Tinel's carpal tunnel.    Lab and Radiology Results  Carpal tunnel median nerve hydrodissection Procedure: Real-time Ultrasound Guided median nerve hydrodissection and carpal tunnel injection right Device: Philips Affiniti 50G/GE Logiq Images permanently stored and available for review in PACS Verbal informed consent obtained.  Discussed risks and benefits of procedure. Warned about infection, bleeding, hyperglycemia damage to structures among others. Patient expresses understanding and agreement Time-out conducted.   Noted no overlying erythema, induration, or other signs of local infection.   Skin prepped in a sterile fashion.   Local anesthesia: Topical Ethyl chloride.   With sterile technique and under real time ultrasound guidance: 40 mg of Kenalog and 1 mL of lidocaine injected into carpal tunnel around the median nerve. Fluid seen entering the carpal tunnel.   Completed without difficulty   Pain immediately resolved suggesting accurate placement of the  medication.   Advised to call if fevers/chills, erythema, induration, drainage, or persistent bleeding.   Images permanently stored and available for review in the ultrasound unit.  Impression: Technically successful ultrasound guided injection.      Assessment and Plan: 78 y.o. female with right carpal tunnel syndrome.  This is recurrent chronic problem.  Previous injection was about 13 months ago.  Plan for repeat carpal tunnel injection today.  Recommend using a carpal tunnel wrist brace at bedtime and continuing hand exercises.  Check back as needed.   PDMP not reviewed this encounter. Orders Placed This Encounter  Procedures   US  LIMITED JOINT SPACE STRUCTURES UP RIGHT(NO LINKED CHARGES)    Reason for Exam (SYMPTOM  OR DIAGNOSIS REQUIRED):   right wrist pain    Preferred imaging location?:   [facility]   No orders of the defined types were placed in this encounter.    Discussed warning signs or symptoms. Please see discharge instructions. Patient expresses understanding.   The above documentation has been reviewed and is accurate and complete Artist Lloyd, M.D.

## 2023-12-01 ENCOUNTER — Ambulatory Visit: Admitting: Family Medicine

## 2023-12-01 ENCOUNTER — Ambulatory Visit: Admitting: Sports Medicine

## 2023-12-01 ENCOUNTER — Other Ambulatory Visit: Payer: Self-pay

## 2023-12-01 VITALS — BP 152/88 | HR 63 | Ht 65.0 in | Wt 234.0 lb

## 2023-12-01 DIAGNOSIS — G5601 Carpal tunnel syndrome, right upper limb: Secondary | ICD-10-CM | POA: Diagnosis not present

## 2023-12-01 DIAGNOSIS — M25531 Pain in right wrist: Secondary | ICD-10-CM

## 2023-12-01 NOTE — Patient Instructions (Addendum)
 Thank you for coming in today.   You received an injection today. Seek immediate medical attention if the joint becomes red, extremely painful, or is oozing fluid.   Check back as needed

## 2023-12-16 DIAGNOSIS — H5202 Hypermetropia, left eye: Secondary | ICD-10-CM | POA: Diagnosis not present

## 2023-12-16 DIAGNOSIS — H5319 Other subjective visual disturbances: Secondary | ICD-10-CM | POA: Diagnosis not present

## 2023-12-16 DIAGNOSIS — H52223 Regular astigmatism, bilateral: Secondary | ICD-10-CM | POA: Diagnosis not present

## 2023-12-16 DIAGNOSIS — H43813 Vitreous degeneration, bilateral: Secondary | ICD-10-CM | POA: Diagnosis not present

## 2023-12-16 DIAGNOSIS — H04123 Dry eye syndrome of bilateral lacrimal glands: Secondary | ICD-10-CM | POA: Diagnosis not present

## 2023-12-16 DIAGNOSIS — H26493 Other secondary cataract, bilateral: Secondary | ICD-10-CM | POA: Diagnosis not present

## 2023-12-16 DIAGNOSIS — Z961 Presence of intraocular lens: Secondary | ICD-10-CM | POA: Diagnosis not present

## 2024-01-07 DIAGNOSIS — H18413 Arcus senilis, bilateral: Secondary | ICD-10-CM | POA: Diagnosis not present

## 2024-01-07 DIAGNOSIS — H02831 Dermatochalasis of right upper eyelid: Secondary | ICD-10-CM | POA: Diagnosis not present

## 2024-01-07 DIAGNOSIS — H26493 Other secondary cataract, bilateral: Secondary | ICD-10-CM | POA: Diagnosis not present

## 2024-01-07 DIAGNOSIS — Z961 Presence of intraocular lens: Secondary | ICD-10-CM | POA: Diagnosis not present

## 2024-01-07 DIAGNOSIS — H26492 Other secondary cataract, left eye: Secondary | ICD-10-CM | POA: Diagnosis not present

## 2024-01-13 DIAGNOSIS — H26492 Other secondary cataract, left eye: Secondary | ICD-10-CM | POA: Diagnosis not present

## 2024-02-09 ENCOUNTER — Ambulatory Visit: Admitting: Physician Assistant

## 2024-02-09 ENCOUNTER — Encounter: Payer: Self-pay | Admitting: Physician Assistant

## 2024-02-09 VITALS — BP 120/82 | HR 59 | Temp 97.2°F | Ht 65.0 in | Wt 240.0 lb

## 2024-02-09 DIAGNOSIS — F419 Anxiety disorder, unspecified: Secondary | ICD-10-CM | POA: Diagnosis not present

## 2024-02-09 DIAGNOSIS — H6121 Impacted cerumen, right ear: Secondary | ICD-10-CM | POA: Diagnosis not present

## 2024-02-09 NOTE — Progress Notes (Signed)
 Sherri Smith is a 78 y.o. female here for a new problem.  History of Present Illness:   Chief Complaint  Patient presents with   Cerumen Impaction    Pt c/o right ear pain x several days, right sided facial pain, ear has felt full for several weeks, uses ear drops to help with wax.   Ear fullness Reports that she has had some fullness sensation in her R ear Reports there is no: fevers/chills, cough, nasal congestion She has tried some drops to help with wax without relief  Anxiety Endorses ongoing anxiety Reports there is no suicidal ideation/hi She is not taking any medications for her mental health, she weaned herself off - felt like they were causing more issues than they were helping She has had interest in seeing a therapist but is not ready at this point to make this jump She is looking into volunteer opportunities and is hopeful this will help her find another support network   Past Medical History:  Diagnosis Date   BMI 40.0-44.9, adult (HCC)    Elevated coronary artery calcium  score    Family history of cardiac disorder    Hyperlipidemia    Insomnia    Leg swelling      Social History   Tobacco Use   Smoking status: Never   Smokeless tobacco: Never  Vaping Use   Vaping status: Never Used  Substance Use Topics   Alcohol use: Yes    Alcohol/week: 4.0 standard drinks of alcohol    Types: 4 Glasses of wine per week    Comment: one glass 5-6x/week   Drug use: Never    Past Surgical History:  Procedure Laterality Date   APPENDECTOMY     OVARIAN CYST REMOVAL     1972   TONSILLECTOMY      Family History  Problem Relation Age of Onset   Breast cancer Mother    Osteoarthritis Mother    Depression Mother    Heart disease Mother    Prostate cancer Father    Osteoarthritis Father    Hyperlipidemia Father    Hypertension Father    Heart disease Maternal Grandmother    Heart disease Maternal Grandfather    Colon cancer Neg Hx    Esophageal cancer Neg  Hx    Stomach cancer Neg Hx     No Known Allergies  Current Medications:   Current Outpatient Medications:    atorvastatin  (LIPITOR) 20 MG tablet, TAKE 1 TABLET(20 MG) BY MOUTH DAILY, Disp: 90 tablet, Rfl: 1   B Complex Vitamins (B COMPLEX-B12) TABS, Take 2 tablets by mouth daily in the afternoon., Disp: , Rfl:    Omega-3 Fatty Acids (FISH OIL) 1000 MG CAPS, Take 1 capsule by mouth daily in the afternoon., Disp: , Rfl:    Turmeric (QC TUMERIC COMPLEX) 500 MG CAPS, Take 1 capsule by mouth daily in the afternoon., Disp: , Rfl:    VITAMIN D  PO, Take 125 mcg by mouth. K 2 included, Disp: , Rfl:    Zinc 100 MG TABS, Take 1 tablet by mouth daily in the afternoon., Disp: , Rfl:   Current Facility-Administered Medications:    0.9 %  sodium chloride  infusion, 500 mL, Intravenous, Once, Federico Rosario BROCKS, MD   Review of Systems:   Negative unless otherwise specified per HPI.  Vitals:   Vitals:   02/09/24 0820  BP: 120/82  Pulse: (!) 59  Temp: (!) 97.2 F (36.2 C)  TempSrc: Temporal  SpO2: 99%  Weight:  240 lb (108.9 kg)  Height: 5' 5 (1.651 m)     Body mass index is 39.94 kg/m.  Physical Exam:   Physical Exam Vitals and nursing note reviewed.  Constitutional:      General: She is not in acute distress.    Appearance: She is well-developed. She is not ill-appearing or toxic-appearing.  HENT:     Right Ear: There is impacted cerumen.  Cardiovascular:     Rate and Rhythm: Normal rate and regular rhythm.     Pulses: Normal pulses.     Heart sounds: Normal heart sounds, S1 normal and S2 normal.  Pulmonary:     Effort: Pulmonary effort is normal.     Breath sounds: Normal breath sounds.  Skin:    General: Skin is warm and dry.  Neurological:     Mental Status: She is alert.     GCS: GCS eye subscore is 4. GCS verbal subscore is 5. GCS motor subscore is 6.  Psychiatric:        Speech: Speech normal.        Behavior: Behavior normal. Behavior is cooperative.    Ceruminosis  is noted.  Wax is removed by syringing and manual debridement. Instructions for home care to prevent wax buildup are given.   Assessment and Plan:   Impacted cerumen of right ear No evidence of infection on my exam Tolerated well If symptom(s) persist, will add antihistamine nasal spray for help with likely eustachian tube dysfunction Follow up based on results  Anxiety Long discussion regarding this Declines any interventions No red flags Recommend close follow up with us  in 1-2 months to further assess, sooner if concerns    Lucie Buttner, PA-C

## 2024-02-10 DIAGNOSIS — H26491 Other secondary cataract, right eye: Secondary | ICD-10-CM | POA: Diagnosis not present

## 2024-02-11 ENCOUNTER — Ambulatory Visit: Admitting: Physician Assistant

## 2024-10-24 ENCOUNTER — Ambulatory Visit
